# Patient Record
Sex: Female | Born: 1960 | Race: Black or African American | Hispanic: No | Smoking: Former smoker
Health system: Southern US, Community
[De-identification: ages and names within clinical notes are randomized; demographics above are authoritative.]

## PROBLEM LIST (undated history)

## (undated) DIAGNOSIS — I10 Essential (primary) hypertension: Secondary | ICD-10-CM

---
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
## 1988-07-12 HISTORY — PX: SALPINGECTOMY: SHX328

## 2007-04-24 ENCOUNTER — Emergency Department: Payer: Self-pay | Admitting: Emergency Medicine

## 2019-10-18 ENCOUNTER — Other Ambulatory Visit: Payer: Self-pay

## 2019-10-18 ENCOUNTER — Observation Stay
Admission: EM | Admit: 2019-10-18 | Discharge: 2019-10-21 | Disposition: A | Discharge status: Home / Self Care | Payer: BC Managed Care – PPO | Attending: Surgery | Admitting: Surgery

## 2019-10-18 ENCOUNTER — Emergency Department: Payer: BC Managed Care – PPO

## 2019-10-18 DIAGNOSIS — S82851A Displaced trimalleolar fracture of right lower leg, initial encounter for closed fracture: Secondary | ICD-10-CM | POA: Diagnosis present

## 2019-10-18 DIAGNOSIS — Z20822 Contact with and (suspected) exposure to covid-19: Secondary | ICD-10-CM | POA: Insufficient documentation

## 2019-10-18 DIAGNOSIS — W010XXA Fall on same level from slipping, tripping and stumbling without subsequent striking against object, initial encounter: Secondary | ICD-10-CM | POA: Diagnosis not present

## 2019-10-18 DIAGNOSIS — Z87891 Personal history of nicotine dependence: Secondary | ICD-10-CM | POA: Diagnosis not present

## 2019-10-18 DIAGNOSIS — S82891A Other fracture of right lower leg, initial encounter for closed fracture: Secondary | ICD-10-CM | POA: Diagnosis present

## 2019-10-18 DIAGNOSIS — Z8781 Personal history of (healed) traumatic fracture: Secondary | ICD-10-CM

## 2019-10-18 DIAGNOSIS — I10 Essential (primary) hypertension: Secondary | ICD-10-CM | POA: Insufficient documentation

## 2019-10-18 DIAGNOSIS — Y92096 Garden or yard of other non-institutional residence as the place of occurrence of the external cause: Secondary | ICD-10-CM | POA: Diagnosis not present

## 2019-10-18 DIAGNOSIS — Z01811 Encounter for preprocedural respiratory examination: Secondary | ICD-10-CM

## 2019-10-18 DIAGNOSIS — Z9889 Other specified postprocedural states: Secondary | ICD-10-CM

## 2019-10-18 DIAGNOSIS — Z419 Encounter for procedure for purposes other than remedying health state, unspecified: Secondary | ICD-10-CM

## 2019-10-18 HISTORY — DX: Essential (primary) hypertension: I10

## 2019-10-18 LAB — CBC WITH DIFFERENTIAL/PLATELET
Abs Immature Granulocytes: 0.07 10*3/uL (ref 0.00–0.07)
Basophils Absolute: 0 10*3/uL (ref 0.0–0.1)
Basophils Relative: 0 %
Eosinophils Absolute: 0 10*3/uL (ref 0.0–0.5)
Eosinophils Relative: 0 %
HCT: 40.8 % (ref 36.0–46.0)
Hemoglobin: 14 g/dL (ref 12.0–15.0)
Immature Granulocytes: 1 %
Lymphocytes Relative: 11 %
Lymphs Abs: 1.6 10*3/uL (ref 0.7–4.0)
MCH: 30.1 pg (ref 26.0–34.0)
MCHC: 34.3 g/dL (ref 30.0–36.0)
MCV: 87.7 fL (ref 80.0–100.0)
Monocytes Absolute: 0.6 10*3/uL (ref 0.1–1.0)
Monocytes Relative: 4 %
Neutro Abs: 12 10*3/uL — ABNORMAL HIGH (ref 1.7–7.7)
Neutrophils Relative %: 84 %
Platelets: 412 10*3/uL — ABNORMAL HIGH (ref 150–400)
RBC: 4.65 MIL/uL (ref 3.87–5.11)
RDW: 11.9 % (ref 11.5–15.5)
WBC: 14.3 10*3/uL — ABNORMAL HIGH (ref 4.0–10.5)
nRBC: 0 % (ref 0.0–0.2)

## 2019-10-18 LAB — COMPREHENSIVE METABOLIC PANEL
ALT: 18 U/L (ref 0–44)
AST: 21 U/L (ref 15–41)
Albumin: 4.5 g/dL (ref 3.5–5.0)
Alkaline Phosphatase: 93 U/L (ref 38–126)
Anion gap: 13 (ref 5–15)
BUN: 21 mg/dL — ABNORMAL HIGH (ref 6–20)
CO2: 25 mmol/L (ref 22–32)
Calcium: 9.7 mg/dL (ref 8.9–10.3)
Chloride: 99 mmol/L (ref 98–111)
Creatinine, Ser: 0.73 mg/dL (ref 0.44–1.00)
GFR calc Af Amer: >60 mL/min (ref >60)
GFR calc non Af Amer: >60 mL/min (ref >60)
Glucose, Bld: 125 mg/dL — ABNORMAL HIGH (ref 70–99)
Potassium: 2.6 mmol/L — CL (ref 3.5–5.1)
Sodium: 137 mmol/L (ref 135–145)
Total Bilirubin: 0.7 mg/dL (ref 0.3–1.2)
Total Protein: 8.3 g/dL — ABNORMAL HIGH (ref 6.5–8.1)

## 2019-10-18 MED ORDER — OXYCODONE HCL 5 MG PO TABS
5.0000 mg | ORAL_TABLET | ORAL | Status: DC | PRN
  Administered 2019-10-19: 08:00:00 5 mg via ORAL
  Filled 2019-10-18: qty 1

## 2019-10-18 MED ORDER — POTASSIUM CHLORIDE IN NACL 20-0.9 MEQ/L-% IV SOLN
Freq: Once | INTRAVENOUS | Status: AC
  Filled 2019-10-18: qty 1000

## 2019-10-18 MED ORDER — HYDROMORPHONE HCL 1 MG/ML IJ SOLN
1.0000 mg | Freq: Once | INTRAMUSCULAR | Status: AC
  Administered 2019-10-18: 23:00:00 1 mg via INTRAVENOUS
  Filled 2019-10-18: qty 1

## 2019-10-18 MED ORDER — FLEET ENEMA 7-19 GM/118ML RE ENEM: 1.0000 | ENEMA | Freq: Once | RECTAL | Status: DC | PRN

## 2019-10-18 MED ORDER — MAGNESIUM HYDROXIDE 400 MG/5ML PO SUSP: 30.0000 mL | Freq: Every day | ORAL | Status: DC | PRN

## 2019-10-18 MED ORDER — HYDROMORPHONE HCL 1 MG/ML IJ SOLN: 0.2500 mg | INTRAMUSCULAR | Status: DC | PRN

## 2019-10-18 MED ORDER — PANTOPRAZOLE SODIUM 40 MG IV SOLR
40.0000 mg | Freq: Every day | INTRAVENOUS | Status: DC
  Administered 2019-10-19: 40 mg via INTRAVENOUS
  Filled 2019-10-18: qty 40

## 2019-10-18 MED ORDER — ONDANSETRON 8 MG PO TBDP
8.0000 mg | ORAL_TABLET | Freq: Once | ORAL | Status: AC
  Administered 2019-10-18: 22:00:00 8 mg via ORAL
  Filled 2019-10-18: qty 1

## 2019-10-18 MED ORDER — POTASSIUM CHLORIDE CRYS ER 20 MEQ PO TBCR
40.0000 meq | EXTENDED_RELEASE_TABLET | Freq: Once | ORAL | Status: AC
  Administered 2019-10-19: 40 meq via ORAL
  Filled 2019-10-18: qty 2

## 2019-10-18 MED ORDER — POTASSIUM CHLORIDE 10 MEQ/100ML IV SOLN
10.0000 meq | INTRAVENOUS | Status: AC
  Administered 2019-10-19 (×2): 10 meq via INTRAVENOUS
  Filled 2019-10-18 (×2): qty 100

## 2019-10-18 MED ORDER — SODIUM CHLORIDE 0.9 % IV SOLN: INTRAVENOUS | Status: DC

## 2019-10-18 MED ORDER — ACETAMINOPHEN 325 MG PO TABS
650.0000 mg | ORAL_TABLET | Freq: Four times a day (QID) | ORAL | Status: DC | PRN
  Administered 2019-10-19: 650 mg via ORAL
  Filled 2019-10-18: qty 2

## 2019-10-18 MED ORDER — ACETAMINOPHEN 650 MG RE SUPP: 650.0000 mg | Freq: Four times a day (QID) | RECTAL | Status: DC | PRN

## 2019-10-18 MED ORDER — CEFAZOLIN SODIUM-DEXTROSE 2-4 GM/100ML-% IV SOLN
2.0000 g | Freq: Once | INTRAVENOUS | Status: DC
  Filled 2019-10-18: qty 100

## 2019-10-18 MED ORDER — BISACODYL 10 MG RE SUPP
10.0000 mg | Freq: Every day | RECTAL | Status: DC | PRN
  Filled 2019-10-18: qty 1

## 2019-10-18 MED ORDER — SODIUM CHLORIDE 0.9 % IV BOLUS
1000.0000 mL | Freq: Once | INTRAVENOUS | Status: AC
  Administered 2019-10-18: 1000 mL via INTRAVENOUS

## 2019-10-18 MED ORDER — DOCUSATE SODIUM 100 MG PO CAPS: 100.0000 mg | ORAL_CAPSULE | Freq: Two times a day (BID) | ORAL | Status: DC

## 2019-10-18 NOTE — ED Provider Notes (Signed)
[facility] Provider Note  ____________________________________________  Time seen: Approximately 9:42 PM  I have reviewed the triage vital signs and the nursing notes.   HISTORY  Chief Complaint Ankle Pain    HPI Stephanie Matthews is a 59 y.o. female who presents the emergency department complaining of right ankle pain/injury.  Patient states that she was walking down her car, slipped on some the loss of her yard and felt her ankle rotate.  Patient reports that she heard a sharp crack, had immediate pain and swelling.  Patient sustained no other injuries during this incident.  She denies hitting her head or losing consciousness.  Patient reports inability to stand or move the ankle joint at this time.  Patient has a history of hypertension but no other chronic medical problems.  No other complaints at this time.         No past medical history on file.  There are no problems to display for this patient.     Prior to Admission medications   Not on File    Allergies Patient has no allergy information on record.  No family history on file.  Social History Social History   Tobacco Use  . Smoking status: Not on file  Substance Use Topics  . Alcohol use: Not on file  . Drug use: Not on file     Review of Systems  Constitutional: No fever/chills Eyes: No visual changes. No discharge ENT: No upper respiratory complaints. Cardiovascular: no chest pain. Respiratory: no cough. No SOB. Gastrointestinal: No abdominal pain.  No nausea, no vomiting.  No diarrhea.  No constipation. Musculoskeletal: Positive for right ankle pain/injury. Skin: Negative for rash, abrasions, lacerations, ecchymosis. Neurological: Negative for headaches, focal weakness or numbness. 10-point ROS otherwise negative.  ____________________________________________   PHYSICAL EXAM:  VITAL SIGNS: ED Triage Vitals  Enc Vitals Group     BP 10/18/19  1943 (!) 175/91     Pulse Rate 10/18/19 1943 (!) 102     Resp 10/18/19 1943 20     Temp 10/18/19 1943 97.9 F (36.6 C)     Temp Source 10/18/19 1943 Oral     SpO2 10/18/19 1944 100 %     Weight 10/18/19 1944 155 lb (70.3 kg)     Height 10/18/19 1944 5\' 7"  (1.702 m)     Head Circumference --      Peak Flow --      Pain Score 10/18/19 1944 4     Pain Loc --      Pain Edu? --      Excl. in [place]? --      Constitutional: Alert and oriented. Well appearing and in no acute distress. Eyes: Conjunctivae are normal. PERRL. EOMI. Head: Atraumatic. ENT:      Ears:       Nose: No congestion/rhinnorhea.      Mouth/Throat: Mucous membranes are moist.  Neck: No stridor.    Cardiovascular: Normal rate, regular rhythm. Normal S1 and S2.  Good peripheral circulation. Respiratory: Normal respiratory effort without tachypnea or retractions. Lungs CTAB. Good air entry to the bases with no decreased or absent breath sounds. Musculoskeletal: Full range of motion to all extremities. No gross deformities appreciated.  Grossly edematous right ankle is appreciated.  No gross deformity.  Patient is exquisitely tender to palpation all about the ankle joint itself.  This includes bilateral malleolus's, posterior aspect, anterior joint line.  No tenderness to palpation over the foot itself.  Dorsalis  pedis pulse intact.  Sensation intact all digits.  No range of motion at this time. Neurologic:  Normal speech and language. No gross focal neurologic deficits are appreciated.  Skin:  Skin is warm, dry and intact. No rash noted. Psychiatric: Mood and affect are normal. Speech and behavior are normal. Patient exhibits appropriate insight and judgement.   ____________________________________________   LABS (all labs ordered are listed, but only abnormal results are displayed)  Labs Reviewed  COMPREHENSIVE METABOLIC PANEL  CBC WITH DIFFERENTIAL/PLATELET    ____________________________________________  EKG   ____________________________________________  RADIOLOGY I personally viewed and evaluated these images as part of my medical decision making, as well as reviewing the written report by the radiologist.  DG Ankle Complete Right  Result Date: 10/18/2019 CLINICAL DATA:  59 year old female status post right ankle rolling injury while walking outside. EXAM: RIGHT ANKLE - COMPLETE 3+ VIEW COMPARISON:  None. FINDINGS: Severe soft tissue swelling about the right ankle. Comminuted, transverse fracture through the medial malleolus. Mild lateral displacement of the mortise joint. Comminuted spiral fracture through the distal right fibula metadiaphysis. Mildly displaced fracture of the posterior malleolus. Talar dome intact. Calcaneus intact. Other visible bones of the right foot appear intact. IMPRESSION: Acute comminuted right ankle trimalleolar fracture with mild laterally dislocated mortise joint. Electronically Signed   By: Odessa Fleming M.D.   On: 10/18/2019 20:03    ____________________________________________    PROCEDURES  Procedure(s) performed:    .Splint Application  Date/Time: 10/18/2019 9:53 PM Performed by: Racheal Patches, PA-C Authorized by: Racheal Patches, PA-C   Consent:    Consent obtained:  Verbal   Consent given by:  Patient   Risks discussed:  Pain and swelling Pre-procedure details:    Sensation:  Normal Procedure details:    Laterality:  Right   Location:  Ankle   Ankle:  R ankle   Splint type:  Short leg and ankle stirrup   Supplies:  Cotton padding, Ortho-Glass and elastic bandage Post-procedure details:    Pain:  Improved   Sensation:  Normal   Patient tolerance of procedure:  Tolerated well, no immediate complications      Medications  HYDROmorphone (DILAUDID) injection 1 mg (has no administration in time range)  ondansetron (ZOFRAN-ODT) disintegrating tablet 8 mg (has no administration  in time range)  sodium chloride 0.9 % bolus 1,000 mL (has no administration in time range)     ____________________________________________   INITIAL IMPRESSION / ASSESSMENT AND PLAN / ED COURSE  Pertinent labs & imaging results that were available during my care of the patient were reviewed by me and considered in my medical decision making (see chart for details).  Review of the [place] CSRS was performed in accordance of the [facility] prior to dispensing any controlled drugs.           Patient's diagnosis is consistent with fall, trimalleolar fracture.  Patient presented to the emergency department with an ankle injury after a mechanical fall.  Patient denied any other complaints or injury other than right ankle pain.  Initial imaging revealed trimalleolar fracture with mild displacement of the ankle mortise joint.  Sensation and capillary refill still intact.  Orthopedic surgeon, Dr. Joice Lofts was consulted after imaging.  He advises that the patient should be admitted, with surgical repair.  Patient will be placed in a posterior OCL with stirrup ankle splint.    ____________________________________________  FINAL CLINICAL IMPRESSION(S) / ED DIAGNOSES  Final diagnoses:  Closed trimalleolar fracture of right ankle, initial encounter  NEW MEDICATIONS STARTED DURING THIS VISIT:  ED Discharge Orders    None          This chart was dictated using voice recognition software/Dragon. Despite best efforts to proofread, errors can occur which can change the meaning. Any change was purely unintentional.    Racheal Patches, PA-C 10/18/19 2154    Miguel Aschoff., MD 10/19/19 [phone]

## 2019-10-18 NOTE — ED Triage Notes (Signed)
Pt was walking outside when she stepped on a round object causing her to roll her ankle. Pt with large amt of swelling noted to right ankle.

## 2019-10-19 ENCOUNTER — Observation Stay: Payer: BC Managed Care – PPO

## 2019-10-19 ENCOUNTER — Other Ambulatory Visit: Payer: Self-pay

## 2019-10-19 ENCOUNTER — Observation Stay: Payer: BC Managed Care – PPO | Admitting: Anesthesiology

## 2019-10-19 ENCOUNTER — Encounter: Payer: Self-pay | Admitting: Surgery

## 2019-10-19 ENCOUNTER — Encounter
Admission: EM | Disposition: A | Discharge status: Home / Self Care | Payer: Self-pay | Source: Home / Self Care | Attending: Student

## 2019-10-19 HISTORY — PX: ORIF ANKLE FRACTURE: SHX5408

## 2019-10-19 LAB — BASIC METABOLIC PANEL
Anion gap: 8 (ref 5–15)
BUN: 16 mg/dL (ref 6–20)
CO2: 26 mmol/L (ref 22–32)
Calcium: 8.9 mg/dL (ref 8.9–10.3)
Chloride: 102 mmol/L (ref 98–111)
Creatinine, Ser: 0.56 mg/dL (ref 0.44–1.00)
GFR calc Af Amer: >60 mL/min (ref >60)
GFR calc non Af Amer: >60 mL/min (ref >60)
Glucose, Bld: 115 mg/dL — ABNORMAL HIGH (ref 70–99)
Potassium: 3.5 mmol/L (ref 3.5–5.1)
Sodium: 136 mmol/L (ref 135–145)

## 2019-10-19 LAB — URINALYSIS, ROUTINE W REFLEX MICROSCOPIC
Bilirubin Urine: NEGATIVE
Glucose, UA: NEGATIVE mg/dL
Hgb urine dipstick: NEGATIVE
Ketones, ur: NEGATIVE mg/dL
Leukocytes,Ua: NEGATIVE
Nitrite: NEGATIVE
Protein, ur: NEGATIVE mg/dL
Specific Gravity, Urine: 1.021 (ref 1.005–1.030)
pH: 5 (ref 5.0–8.0)

## 2019-10-19 LAB — SARS CORONAVIRUS 2 (TAT 6-24 HRS): SARS Coronavirus 2: NEGATIVE

## 2019-10-19 LAB — HIV ANTIBODY (ROUTINE TESTING W REFLEX): HIV Screen 4th Generation wRfx: NONREACTIVE

## 2019-10-19 SURGERY — OPEN REDUCTION INTERNAL FIXATION (ORIF) ANKLE FRACTURE
Anesthesia: General | Site: Ankle | Laterality: Right

## 2019-10-19 MED ORDER — SUCCINYLCHOLINE CHLORIDE 200 MG/10ML IV SOSY
PREFILLED_SYRINGE | INTRAVENOUS | Status: AC
  Filled 2019-10-19: qty 10

## 2019-10-19 MED ORDER — ONDANSETRON HCL 4 MG/2ML IJ SOLN
4.0000 mg | Freq: Once | INTRAMUSCULAR | Status: AC
  Administered 2019-10-19: 4 mg via INTRAVENOUS

## 2019-10-19 MED ORDER — MAGNESIUM HYDROXIDE 400 MG/5ML PO SUSP
30.0000 mL | Freq: Every day | ORAL | Status: DC | PRN
  Administered 2019-10-20: 19:00:00 30 mL via ORAL
  Filled 2019-10-19 (×3): qty 30

## 2019-10-19 MED ORDER — ONDANSETRON HCL 4 MG/2ML IJ SOLN
INTRAMUSCULAR | Status: AC
  Filled 2019-10-19: qty 2

## 2019-10-19 MED ORDER — DOCUSATE SODIUM 100 MG PO CAPS
100.0000 mg | ORAL_CAPSULE | Freq: Two times a day (BID) | ORAL | Status: DC
  Administered 2019-10-19 – 2019-10-21 (×3): 100 mg via ORAL
  Filled 2019-10-19 (×7): qty 1

## 2019-10-19 MED ORDER — LACTATED RINGERS IV SOLN: INTRAVENOUS | Status: DC | PRN

## 2019-10-19 MED ORDER — ONDANSETRON HCL 4 MG/2ML IJ SOLN
INTRAMUSCULAR | Status: DC | PRN
  Administered 2019-10-19: 4 mg via INTRAVENOUS

## 2019-10-19 MED ORDER — ACETAMINOPHEN 10 MG/ML IV SOLN
INTRAVENOUS | Status: AC
  Filled 2019-10-19: qty 100

## 2019-10-19 MED ORDER — METOCLOPRAMIDE HCL 10 MG PO TABS: 5.0000 mg | ORAL_TABLET | Freq: Three times a day (TID) | ORAL | Status: DC | PRN

## 2019-10-19 MED ORDER — LOSARTAN POTASSIUM 50 MG PO TABS: 75.0000 mg | ORAL_TABLET | Freq: Every day | ORAL | Status: DC

## 2019-10-19 MED ORDER — TRAMADOL HCL 50 MG PO TABS: 50.0000 mg | ORAL_TABLET | Freq: Four times a day (QID) | ORAL | Status: DC

## 2019-10-19 MED ORDER — FLEET ENEMA 7-19 GM/118ML RE ENEM: 1.0000 | ENEMA | Freq: Once | RECTAL | Status: DC | PRN

## 2019-10-19 MED ORDER — FENTANYL CITRATE (PF) 100 MCG/2ML IJ SOLN
25.0000 ug | INTRAMUSCULAR | Status: DC | PRN
  Administered 2019-10-19: 25 ug via INTRAVENOUS

## 2019-10-19 MED ORDER — CEFAZOLIN SODIUM-DEXTROSE 2-4 GM/100ML-% IV SOLN
2.0000 g | Freq: Four times a day (QID) | INTRAVENOUS | Status: AC
  Administered 2019-10-20: 01:00:00 2 g via INTRAVENOUS
  Filled 2019-10-19: qty 100

## 2019-10-19 MED ORDER — DEXAMETHASONE SODIUM PHOSPHATE 10 MG/ML IJ SOLN
INTRAMUSCULAR | Status: AC
  Filled 2019-10-19: qty 1

## 2019-10-19 MED ORDER — MIDAZOLAM HCL 2 MG/2ML IJ SOLN
INTRAMUSCULAR | Status: AC
  Filled 2019-10-19: qty 2

## 2019-10-19 MED ORDER — DEXAMETHASONE SODIUM PHOSPHATE 10 MG/ML IJ SOLN
INTRAMUSCULAR | Status: DC | PRN
  Administered 2019-10-19: 8 mg via INTRAVENOUS

## 2019-10-19 MED ORDER — HYDROMORPHONE HCL 1 MG/ML IJ SOLN: 0.2500 mg | INTRAMUSCULAR | Status: DC | PRN

## 2019-10-19 MED ORDER — PROPOFOL 10 MG/ML IV BOLUS
INTRAVENOUS | Status: AC
  Filled 2019-10-19: qty 20

## 2019-10-19 MED ORDER — LIDOCAINE HCL (CARDIAC) PF 100 MG/5ML IV SOSY
PREFILLED_SYRINGE | INTRAVENOUS | Status: DC | PRN
  Administered 2019-10-19 (×2): 50 mg via INTRAVENOUS

## 2019-10-19 MED ORDER — FENTANYL CITRATE (PF) 100 MCG/2ML IJ SOLN
INTRAMUSCULAR | Status: AC
  Filled 2019-10-19: qty 2

## 2019-10-19 MED ORDER — SUCCINYLCHOLINE CHLORIDE 20 MG/ML IJ SOLN
INTRAMUSCULAR | Status: DC | PRN
  Administered 2019-10-19: 100 mg via INTRAVENOUS

## 2019-10-19 MED ORDER — ACETAMINOPHEN 10 MG/ML IV SOLN
INTRAVENOUS | Status: DC | PRN
  Administered 2019-10-19: 1000 mg via INTRAVENOUS

## 2019-10-19 MED ORDER — OXYCODONE HCL 5 MG PO TABS: 5.0000 mg | ORAL_TABLET | ORAL | Status: DC | PRN

## 2019-10-19 MED ORDER — KETOROLAC TROMETHAMINE 15 MG/ML IJ SOLN
15.0000 mg | Freq: Four times a day (QID) | INTRAMUSCULAR | Status: AC
  Administered 2019-10-20: 15 mg via INTRAVENOUS

## 2019-10-19 MED ORDER — ENOXAPARIN SODIUM 40 MG/0.4ML ~~LOC~~ SOLN
40.0000 mg | SUBCUTANEOUS | Status: DC
  Administered 2019-10-21: 08:00:00 40 mg via SUBCUTANEOUS

## 2019-10-19 MED ORDER — KETOROLAC TROMETHAMINE 30 MG/ML IJ SOLN
INTRAMUSCULAR | Status: AC
  Administered 2019-10-19: 15:00:00 30 mg via INTRAVENOUS
  Filled 2019-10-19: qty 1

## 2019-10-19 MED ORDER — ONDANSETRON HCL 4 MG/2ML IJ SOLN: 4.0000 mg | Freq: Four times a day (QID) | INTRAMUSCULAR | Status: DC | PRN

## 2019-10-19 MED ORDER — DIPHENHYDRAMINE HCL 12.5 MG/5ML PO ELIX
12.5000 mg | ORAL_SOLUTION | ORAL | Status: DC | PRN
  Filled 2019-10-19: qty 10

## 2019-10-19 MED ORDER — METOCLOPRAMIDE HCL 5 MG/ML IJ SOLN: 5.0000 mg | Freq: Three times a day (TID) | INTRAMUSCULAR | Status: DC | PRN

## 2019-10-19 MED ORDER — CEFAZOLIN SODIUM-DEXTROSE 2-4 GM/100ML-% IV SOLN
INTRAVENOUS | Status: AC
  Administered 2019-10-19: 13:00:00 2 g via INTRAVENOUS
  Filled 2019-10-19: qty 100

## 2019-10-19 MED ORDER — FENTANYL CITRATE (PF) 100 MCG/2ML IJ SOLN
INTRAMUSCULAR | Status: DC | PRN
  Administered 2019-10-19 (×3): 50 ug via INTRAVENOUS

## 2019-10-19 MED ORDER — ACETAMINOPHEN 325 MG PO TABS: 325.0000 mg | ORAL_TABLET | Freq: Four times a day (QID) | ORAL | Status: DC | PRN

## 2019-10-19 MED ORDER — CEFAZOLIN SODIUM-DEXTROSE 2-4 GM/100ML-% IV SOLN
INTRAVENOUS | Status: AC
  Administered 2019-10-19: 19:00:00 2 g via INTRAVENOUS
  Filled 2019-10-19: qty 100

## 2019-10-19 MED ORDER — OXYCODONE HCL 5 MG/5ML PO SOLN
ORAL | Status: AC
  Filled 2019-10-19: qty 5

## 2019-10-19 MED ORDER — CHLORTHALIDONE 25 MG PO TABS
25.0000 mg | ORAL_TABLET | Freq: Every morning | ORAL | Status: DC
  Administered 2019-10-21: 10:00:00 25 mg via ORAL
  Filled 2019-10-19 (×2): qty 1

## 2019-10-19 MED ORDER — CEFAZOLIN SODIUM-DEXTROSE 2-3 GM-%(50ML) IV SOLR
INTRAVENOUS | Status: DC | PRN
  Administered 2019-10-19: 2 g via INTRAVENOUS

## 2019-10-19 MED ORDER — ACETAMINOPHEN 500 MG PO TABS
ORAL_TABLET | ORAL | Status: AC
  Administered 2019-10-19: 21:00:00 1000 mg via ORAL
  Filled 2019-10-19: qty 2

## 2019-10-19 MED ORDER — KETOROLAC TROMETHAMINE 15 MG/ML IJ SOLN
INTRAMUSCULAR | Status: AC
  Administered 2019-10-19: 22:00:00 15 mg via INTRAVENOUS
  Filled 2019-10-19: qty 1

## 2019-10-19 MED ORDER — KETOROLAC TROMETHAMINE 30 MG/ML IJ SOLN: 30.0000 mg | Freq: Once | INTRAMUSCULAR | Status: AC

## 2019-10-19 MED ORDER — BISACODYL 10 MG RE SUPP
10.0000 mg | Freq: Every day | RECTAL | Status: DC | PRN
  Filled 2019-10-19: qty 1

## 2019-10-19 MED ORDER — ACETAMINOPHEN 500 MG PO TABS: 1000.0000 mg | ORAL_TABLET | Freq: Four times a day (QID) | ORAL | Status: AC

## 2019-10-19 MED ORDER — BUPIVACAINE HCL (PF) 0.5 % IJ SOLN
INTRAMUSCULAR | Status: AC
  Filled 2019-10-19: qty 30

## 2019-10-19 MED ORDER — ONDANSETRON HCL 4 MG PO TABS
4.0000 mg | ORAL_TABLET | Freq: Four times a day (QID) | ORAL | Status: DC | PRN
  Administered 2019-10-20: 14:00:00 4 mg via ORAL

## 2019-10-19 MED ORDER — OXYCODONE HCL 5 MG/5ML PO SOLN
5.0000 mg | Freq: Once | ORAL | Status: AC | PRN
  Administered 2019-10-19: 5 mg via ORAL

## 2019-10-19 MED ORDER — BUPIVACAINE HCL 0.5 % IJ SOLN
INTRAMUSCULAR | Status: DC | PRN
  Administered 2019-10-19: 30 mL

## 2019-10-19 MED ORDER — FENTANYL CITRATE (PF) 100 MCG/2ML IJ SOLN
INTRAMUSCULAR | Status: AC
  Administered 2019-10-19: 16:00:00 25 ug via INTRAVENOUS
  Filled 2019-10-19: qty 2

## 2019-10-19 MED ORDER — TRAMADOL HCL 50 MG PO TABS
ORAL_TABLET | ORAL | Status: AC
  Administered 2019-10-19: 50 mg via ORAL
  Filled 2019-10-19: qty 1

## 2019-10-19 MED ORDER — PROPOFOL 10 MG/ML IV BOLUS
INTRAVENOUS | Status: DC | PRN
  Administered 2019-10-19: 150 mg via INTRAVENOUS

## 2019-10-19 MED ORDER — POTASSIUM CHLORIDE IN NACL 20-0.9 MEQ/L-% IV SOLN
INTRAVENOUS | Status: DC
  Filled 2019-10-19 (×5): qty 1000

## 2019-10-19 MED ORDER — PROMETHAZINE HCL 25 MG/ML IJ SOLN: 6.2500 mg | INTRAMUSCULAR | Status: DC | PRN

## 2019-10-19 MED ORDER — OXYCODONE HCL 5 MG PO TABS: 5.0000 mg | ORAL_TABLET | Freq: Once | ORAL | Status: AC | PRN

## 2019-10-19 SURGICAL SUPPLY — 63 items
BIT DRILL 2.5X2.75 QC CALB (BIT) ×3 IMPLANT
BIT DRILL 2.9 CANN QC NONSTRL (BIT) ×3 IMPLANT
BIT DRILL 3.5X5.5 QC CALB (BIT) ×3 IMPLANT
BIT DRILL CALIBRATED 2.7 (BIT) ×2 IMPLANT
BIT DRILL CALIBRATED 2.7MM (BIT) ×1
BLADE SURG SZ10 CARB STEEL (BLADE) ×6 IMPLANT
BNDG COHESIVE 4X5 TAN STRL (GAUZE/BANDAGES/DRESSINGS) ×3 IMPLANT
BNDG ELASTIC 4X5.8 VLCR STR LF (GAUZE/BANDAGES/DRESSINGS) ×6 IMPLANT
BNDG ELASTIC 6X5.8 VLCR STR LF (GAUZE/BANDAGES/DRESSINGS) ×3 IMPLANT
BNDG ESMARK 6X12 TAN STRL LF (GAUZE/BANDAGES/DRESSINGS) ×3 IMPLANT
BNDG PLASTER FAST 4X5 WHT LF (CAST SUPPLIES) IMPLANT
CANISTER SUCT 1200ML W/VALVE (MISCELLANEOUS) ×3 IMPLANT
CHLORAPREP W/TINT 26 (MISCELLANEOUS) ×6 IMPLANT
COVER WAND RF STERILE (DRAPES) ×3 IMPLANT
CUFF TOURN SGL QUICK 18X4 (TOURNIQUET CUFF) ×3 IMPLANT
CUFF TOURN SGL QUICK 24 (TOURNIQUET CUFF)
CUFF TOURN SGL QUICK 30 (TOURNIQUET CUFF)
CUFF TRNQT CYL 24X4X16.5-23 (TOURNIQUET CUFF) IMPLANT
CUFF TRNQT CYL 30X4X21-28X (TOURNIQUET CUFF) IMPLANT
DRAPE C-ARM XRAY 36X54 (DRAPES) ×3 IMPLANT
DRAPE C-ARMOR (DRAPES) ×3 IMPLANT
DRAPE INCISE IOBAN 66X45 STRL (DRAPES) ×3 IMPLANT
DRAPE SPLIT 6X30 W/TAPE (DRAPES) ×3 IMPLANT
DRAPE U-SHAPE 47X51 STRL (DRAPES) ×3 IMPLANT
ELECT CAUTERY BLADE 6.4 (BLADE) ×3 IMPLANT
ELECT REM PT RETURN 9FT ADLT (ELECTROSURGICAL) ×3
ELECTRODE REM PT RTRN 9FT ADLT (ELECTROSURGICAL) ×1 IMPLANT
GAUZE SPONGE 4X4 12PLY STRL (GAUZE/BANDAGES/DRESSINGS) ×3 IMPLANT
GAUZE XEROFORM 1X8 LF (GAUZE/BANDAGES/DRESSINGS) ×3 IMPLANT
GLOVE BIO SURGEON STRL SZ8 (GLOVE) ×6 IMPLANT
GLOVE INDICATOR 8.0 STRL GRN (GLOVE) ×3 IMPLANT
GOWN STRL REUS W/ TWL LRG LVL3 (GOWN DISPOSABLE) ×1 IMPLANT
GOWN STRL REUS W/ TWL XL LVL3 (GOWN DISPOSABLE) ×1 IMPLANT
GOWN STRL REUS W/TWL LRG LVL3 (GOWN DISPOSABLE) ×2
GOWN STRL REUS W/TWL XL LVL3 (GOWN DISPOSABLE) ×2
HEMOVAC 400ML (MISCELLANEOUS) ×3
K-WIRE ACE 1.6X6 (WIRE) ×6
KIT DRAIN HEMOVAC JP 7FR 400ML (MISCELLANEOUS) ×1 IMPLANT
KIT TURNOVER KIT A (KITS) ×3 IMPLANT
KWIRE ACE 1.6X6 (WIRE) ×2 IMPLANT
LABEL OR SOLS (LABEL) ×3 IMPLANT
NS IRRIG 1000ML POUR BTL (IV SOLUTION) ×3 IMPLANT
PACK EXTREMITY ARMC (MISCELLANEOUS) ×3 IMPLANT
PAD ABD DERMACEA PRESS 5X9 (GAUZE/BANDAGES/DRESSINGS) ×6 IMPLANT
PAD CAST CTTN 4X4 STRL (SOFTGOODS) ×2 IMPLANT
PAD PREP 24X41 OB/GYN DISP (PERSONAL CARE ITEMS) ×3 IMPLANT
PADDING CAST COTTON 4X4 STRL (SOFTGOODS) ×4
PLATE LOCK 7H 92 BILAT FIB (Plate) ×3 IMPLANT
SCREW ACE CAN 4.0 40M (Screw) ×3 IMPLANT
SCREW CORTICAL 3.5MM  28MM (Screw) ×2 IMPLANT
SCREW CORTICAL 3.5MM 28MM (Screw) ×1 IMPLANT
SCREW LOCK CORT STAR 3.5X12 (Screw) ×9 IMPLANT
SCREW NON LOCKING LP 3.5 14MM (Screw) ×3 IMPLANT
SCREW NON LOCKING LP 3.5 16MM (Screw) ×6 IMPLANT
SPLINT CAST 1 STEP 4X30 (MISCELLANEOUS) ×6 IMPLANT
SPONGE LAP 18X18 RF (DISPOSABLE) ×3 IMPLANT
STAPLER SKIN PROX 35W (STAPLE) ×3 IMPLANT
STOCKINETTE IMPERV 14X48 (MISCELLANEOUS) ×3 IMPLANT
SUT VIC AB 0 CT1 36 (SUTURE) ×3 IMPLANT
SUT VIC AB 2-0 CT2 27 (SUTURE) ×3 IMPLANT
SUT VIC AB 2-0 SH 27 (SUTURE) ×4
SUT VIC AB 2-0 SH 27XBRD (SUTURE) ×2 IMPLANT
SYR 10ML LL (SYRINGE) ×3 IMPLANT

## 2019-10-19 NOTE — ED Notes (Signed)
Surgeon was here to speak with pt and explain procedure

## 2019-10-19 NOTE — Transfer of Care (Signed)
Immediate Anesthesia Transfer of Care Note  Patient: Stephanie Matthews  Procedure(s) Performed: OPEN REDUCTION INTERNAL FIXATION (ORIF) ANKLE FRACTURE (Right Ankle)  Patient Location: PACU  Anesthesia Type:General  Level of Consciousness: sedated  Airway & Oxygen Therapy: Patient Spontanous Breathing and Patient connected to face mask oxygen  Post-op Assessment: Report given to RN and Post -op Vital signs reviewed and stable  Post vital signs: Reviewed and stable  Last Vitals:  Vitals Value Taken Time  BP 130/94 10/19/19 1503  Temp 36.1 C 10/19/19 1503  Pulse 89 10/19/19 1509  Resp 14 10/19/19 1509  SpO2 100 % 10/19/19 1509  Vitals shown include unvalidated device data.  Last Pain:  Vitals:   10/19/19 1503  TempSrc:   PainSc: Asleep         Complications: No apparent anesthesia complications

## 2019-10-19 NOTE — Progress Notes (Signed)
Back to bed  Used BSC   Circulation good to toes elevated leg on pillows with ice pack

## 2019-10-19 NOTE — ED Notes (Signed)
Pt taken to OR (pre op). She has her purse and her clothing with her

## 2019-10-19 NOTE — Op Note (Signed)
10/19/2019  3:03 PM  Patient:   Stephanie Matthews  Pre-Op Diagnosis:   Closed displaced trimalleolar fracture, right ankle.  Post-Op Diagnosis:   Same.  Procedure:   Open reduction and internal fixation of displaced medial and lateral malleolar line, right ankle.  Surgeon:   Pascal Lux, MD  Assistant:   None  Anesthesia:   GET  Findings:   As above.  Complications:   None  EBL:   50 cc  Fluids:   300 cc crystalloid  UOP:   None  TT:   80 min at 250 mmHg  Drains:   None  Closure:   Staples  Implants:   Biomet ALPS 7-hole composite locking plate and screws  Brief Clinical Note:   The patient is a 59 year old female who sustained the above-noted injury last evening when she apparently rolled her ankle when she stepped on some "gum balls" in her yard while walking to her car. She presented to the emergency room where x-rays demonstrated the above-noted injury. The patient was admitted and presents at this time for definitive management of his/her injury.  Procedure:   The patient was brought into the operating room and lain in the supine position.  After adequate general endotracheal intubation and anesthesia was obtained, the patient's right foot and lower leg were prepped with ChloraPrep solution, then draped sterilely. Preoperative antibiotics were administered. A timeout was performed to verify the appropriate surgical site before the limb was exsanguinated with an Esmarch and the calf tourniquet inflated to 250 mmHg.   Laterally, an 8-10 cm incision was made over the lateral aspect of the distal fibula. The incision was carried down through the subcutaneous tissues to expose the fracture site. The fracture hematoma was debrided before the fracture was reduced and temporarily secured using a bone clamp. A lag screw was placed in an anterior to posterior direction perpendicular to the fracture. A 7-hole Biomet composite locking plate was contoured using the appropriate plate  benders before it was applied over the lateral aspect of the distal fibula. After verifying its position fluoroscopically, it was secured using a 3.5 mm nonlocking cortical screw proximal to the fracture. Again the plate's position was adjusted slightly based on AP and lateral projections before it was secured using additional bicortical screws proximally and multiple locking screws distally. The adequacy of fracture reduction and hardware position was verified fluoroscopically in AP and lateral projections and found to be excellent.   Attention was directed to the medial side. An approximately 3-4 cm longitudinal incision was made over the anterior and distal portions of the medial malleolus. This incision also was carried down through the subcutaneous tissues to expose the fracture site. Care was taken to identify and protect the saphenous nerve and vein. The fracture hematoma again was removed before the fracture was reduced. Two small loose medial cortical fragments were removed as well.  A single guidewire was placed obliquely across the fracture from distal to proximal into the distal tibial metaphysis. Consideration was given to placing a second K wire, but the fracture fragment was too small to accommodate a second screw. After verifying its position fluoroscopically, the guidewire was over-reamed and replaced with a 40 mm partially threaded 4.0 cancellous screw in lag fashion. Again the adequacy of fracture reduction, hardware position, and mortise restoration was verified in AP, lateral, and oblique projections and found to be excellent.  Each wound was copiously irrigated with sterile saline solution. Laterally, the subcutaneous tissues were closed in two  layers using 2-0 Vicryl interrupted sutures before the skin was closed using staples. Medially, the subcutaneous tissues were closed using 2-0 Vicryl interrupted sutures before the skin was closed using staples. A total of 30 cc of 0.5% plain  Sensorcaine was injected in and around the incision sites to help with postoperative analgesia. Sterile bulky dressings were applied to the wounds before the patient was placed into a posterior splint with a sugar tong supplement, maintaining the ankle in neutral dorsiflexion. The patient was then awakened, extubated, and returned to the recovery room in satisfactory condition after tolerating the procedure well.

## 2019-10-19 NOTE — Anesthesia Preprocedure Evaluation (Signed)
Anesthesia Evaluation  Patient identified by MRN, date of birth, ID band Patient awake    Reviewed: Allergy & Precautions, H&P , NPO status , Patient's Chart, lab work & pertinent test results  Airway Mallampati: II  TM Distance: >3 FB Neck ROM: full    Dental  (+) Partial Upper Overbite (has an upper partial):   Pulmonary neg pulmonary ROS, neg COPD, former smoker,           Cardiovascular hypertension, (-) angina(-) Past MI (-) dysrhythmias      Neuro/Psych negative neurological ROS  negative psych ROS   GI/Hepatic negative GI ROS, Neg liver ROS,   Endo/Other  negative endocrine ROS  Renal/GU      Musculoskeletal   Abdominal   Peds  Hematology negative hematology ROS (+)   Anesthesia Other Findings Nausea  Past Medical History: No date: Hypertension  Past Surgical History: 1979: BREAST LUMPECTOMY; Left 1990: SALPINGECTOMY; Bilateral  BMI    Body Mass Index: 24.28 kg/m      Reproductive/Obstetrics negative OB ROS                             Anesthesia Physical Anesthesia Plan  ASA: II  Anesthesia Plan: General ETT and Rapid Sequence   Post-op Pain Management:    Induction:   PONV Risk Score and Plan: Ondansetron, Dexamethasone, Midazolam and Treatment may vary due to age or medical condition  Airway Management Planned: Natural Airway and Nasal Cannula  Additional Equipment:   Intra-op Plan:   Post-operative Plan:   Informed Consent: I have reviewed the patients History and Physical, chart, labs and discussed the procedure including the risks, benefits and alternatives for the proposed anesthesia with the patient or authorized representative who has indicated his/her understanding and acceptance.     Dental Advisory Given  Plan Discussed with: Anesthesiologist  Anesthesia Plan Comments:         Anesthesia Quick Evaluation

## 2019-10-19 NOTE — Anesthesia Procedure Notes (Signed)
Procedure Name: Intubation Date/Time: 10/19/2019 1:06 PM Performed by: Omer Jack, CRNA Pre-anesthesia Checklist: Patient identified, Patient being monitored, Timeout performed, Emergency Drugs available and Suction available Patient Re-evaluated:Patient Re-evaluated prior to induction Oxygen Delivery Method: Circle system utilized Preoxygenation: Pre-oxygenation with 100% oxygen Induction Type: IV induction, Cricoid Pressure applied and Rapid sequence Ventilation: Mask ventilation without difficulty Laryngoscope Size: 3 and McGraph Grade View: Grade II Tube type: Oral Tube size: 7.0 mm Number of attempts: 2 Airway Equipment and Method: Stylet and Bougie stylet Placement Confirmation: ETT inserted through vocal cords under direct vision,  positive ETCO2 and breath sounds checked- equal and bilateral Secured at: 22 cm Tube secured with: Tape Dental Injury: Teeth and Oropharynx as per pre-operative assessment  Difficulty Due To: Difficulty was unanticipated and Difficult Airway- due to anterior larynx Future Recommendations: Recommend- induction with short-acting agent, and alternative techniques readily available Comments: Right lower tooth very loose. Still intact

## 2019-10-19 NOTE — H&P (Signed)
Subjective:  Chief complaint: Right ankle pain.  The patient is a 59 y.o. female who sustained an injury to the right ankle last evening when she apparently slipped on some "gum balls" in her yard. The patient was unable to bear weight on her ankle following this injury, so she was brought to the emergency room where x-rays demonstrated a trimalleolar fracture subluxation of the right ankle. The patient denies any associated injury. The patient did not strike her head or lose consciousness. The patient also denies any light-headedness, dizziness, chest pain, or shortness of breath which might have contributed to the injury.  The patient was placed into a posterior splint by the ER provider and is to be admitted at this time in preparation for definitive management of this injury.  Patient Active Problem List   Diagnosis Date Noted  . Ankle fracture, right 10/18/2019   History reviewed. No pertinent past medical history.   No Known Allergies  Social History   Tobacco Use  . Smoking status: Not on file  Substance Use Topics  . Alcohol use: Not on file    No family history on file.   Review of Systems: As noted above. The patient denies any chest pain, shortness of breath, nausea, vomiting, diarrhea, constipation, belly pain, blood in her stool, or burning with urination.  Objective: Temp:  [97.9 F (36.6 C)] 97.9 F (36.6 C) (04/08 1943) Pulse Rate:  [82-102] 98 (04/09 0537) Resp:  [14-20] 14 (04/09 0537) BP: (116-175)/(75-91) 123/75 (04/09 0537) SpO2:  [93 %-100 %] 99 % (04/09 0537) Weight:  [70.3 kg] 70.3 kg (04/08 1944)  Physical Exam: General:  Alert, no acute distress Psychiatric:  Patient is competent for consent with normal mood and affect Cardiovascular:  RRR  Respiratory:  Clear to auscultation. No wheezing. Non-labored breathing GI:  Abdomen is soft and non-tender Skin:  No lesions in the area of chief complaint Neurologic:  Sensation intact distally Lymphatic:  No  axillary or cervical lymphadenopathy  Orthopedic Exam:  Orthopedic examination is limited to the right lower leg and foot.  The patient is in a posterior splint with a sugar tong supplement which appears to be fitting her well.  Skin inspection shows no abnormalities of the proximal distal ends of the splint.  She is able dorsiflex and plantarflex her toes.  Sensation is intact to light touch to her toes, including the deep peroneal nerve distribution.  She has good capillary refill to her digits.  Imaging Review: Recent x-rays of the right ankle are available for review and have been reviewed by myself.  These films demonstrate a trimalleolar fracture subluxation of the right ankle with comminuted medial and lateral malleolar fractures and a small posterior lip fracture of the distal tibia.  No significant degenerative changes are identified.  No lytic lesions or other acute bony abnormalities are noted..  Assessment: Closed displaced trimalleolar fracture right ankle.  Plan: The treatment options, including both surgical and nonsurgical choices, have been discussed in detail with the patient.  The patient would like to proceed with surgical intervention to include an open reduction and internal fixation of her right ankle fractures. The risks (including bleeding, infection, nerve and/or blood vessel injury, persistent or recurrent pain, loosening or failure of the components, leg length inequality, dislocation, need for further surgery, blood clots, strokes, heart attacks or arrhythmias, pneumonia, etc.) and benefits of the surgical procedure were discussed. The patient states her understanding and agrees to proceed. A formal written consent will be obtained  by the nursing staff.

## 2019-10-19 NOTE — ED Notes (Signed)
Patient aware that we need urine sample for testing, unable at this time. Pt given instruction on providing urine sample when able to do so.   

## 2019-10-19 NOTE — ED Notes (Signed)
Posterior OCL with stirrup splint performed. Notified Jonathan,PA and approved splint.  Pt did not report any discomfort or pain after splint placed.

## 2019-10-19 NOTE — Progress Notes (Signed)
Up to chair

## 2019-10-20 LAB — BASIC METABOLIC PANEL
Anion gap: 6 (ref 5–15)
BUN: 15 mg/dL (ref 6–20)
CO2: 28 mmol/L (ref 22–32)
Calcium: 8.7 mg/dL — ABNORMAL LOW (ref 8.9–10.3)
Chloride: 104 mmol/L (ref 98–111)
Creatinine, Ser: 0.55 mg/dL (ref 0.44–1.00)
GFR calc Af Amer: >60 mL/min (ref >60)
GFR calc non Af Amer: >60 mL/min (ref >60)
Glucose, Bld: 110 mg/dL — ABNORMAL HIGH (ref 70–99)
Potassium: 3.5 mmol/L (ref 3.5–5.1)
Sodium: 138 mmol/L (ref 135–145)

## 2019-10-20 MED ORDER — ASPIRIN EC 325 MG PO TBEC: 325.0000 mg | DELAYED_RELEASE_TABLET | Freq: Every day | ORAL | 0 refills | Status: AC

## 2019-10-20 MED ORDER — OXYCODONE HCL 5 MG PO TABS: 5.0000 mg | ORAL_TABLET | ORAL | 0 refills | Status: DC | PRN

## 2019-10-20 MED ORDER — KETOROLAC TROMETHAMINE 15 MG/ML IJ SOLN
INTRAMUSCULAR | Status: AC
  Filled 2019-10-20: qty 1

## 2019-10-20 MED ORDER — ACETAMINOPHEN 500 MG PO TABS
ORAL_TABLET | ORAL | Status: AC
  Administered 2019-10-20: 08:00:00 1000 mg via ORAL
  Filled 2019-10-20: qty 2

## 2019-10-20 MED ORDER — ACETAMINOPHEN 500 MG PO TABS
ORAL_TABLET | ORAL | Status: AC
  Administered 2019-10-20: 1000 mg via ORAL
  Filled 2019-10-20: qty 2

## 2019-10-20 MED ORDER — TRAMADOL HCL 50 MG PO TABS
ORAL_TABLET | ORAL | Status: AC
  Administered 2019-10-20: 06:00:00 50 mg via ORAL
  Filled 2019-10-20: qty 1

## 2019-10-20 MED ORDER — KETOROLAC TROMETHAMINE 15 MG/ML IJ SOLN
INTRAMUSCULAR | Status: AC
  Administered 2019-10-20: 03:00:00 15 mg via INTRAVENOUS
  Filled 2019-10-20: qty 1

## 2019-10-20 MED ORDER — ONDANSETRON 4 MG PO TBDP
ORAL_TABLET | ORAL | Status: AC
  Filled 2019-10-20: qty 1

## 2019-10-20 MED ORDER — ENOXAPARIN SODIUM 40 MG/0.4ML ~~LOC~~ SOLN
SUBCUTANEOUS | Status: AC
  Administered 2019-10-20: 08:00:00 40 mg via SUBCUTANEOUS
  Filled 2019-10-20: qty 0.4

## 2019-10-20 NOTE — Progress Notes (Signed)
Dr. Allena Katz in to talk with patient.

## 2019-10-20 NOTE — Discharge Summary (Addendum)
Physician Discharge Summary  Patient ID: Stephanie Matthews MRN: [id number] DOB/AGE: 03-23-1961 59 y.o.  Admit date: 10/18/2019 Discharge date: 10/21/2019 Admission Diagnoses:  Ankle fracture, right [W09.811B] Pre-op chest exam [Z01.811] Closed trimalleolar fracture of right ankle, initial encounter [S82.851A]   Discharge Diagnoses: Patient Active Problem List   Diagnosis Date Noted  . Ankle fracture, right 10/18/2019    Past Medical History:  Diagnosis Date  . Hypertension      Transfusion: none   Consultants (if any):   Discharged Condition: Improved  Hospital Course: Stephanie Matthews is an 59 y.o. female who was admitted 10/18/2019 with a diagnosis of displaced right ankle fracture and went to the operating room on 10/19/2019 and underwent the above named procedures.    Surgeries: Procedure(s): OPEN REDUCTION INTERNAL FIXATION (ORIF) ANKLE FRACTURE on 10/19/2019 Patient tolerated the surgery well. Taken to PACU where she was stabilized and then transferred to the orthopedic floor.  Started on Lovenox 40 mg q 24 hrs. SCDs. Heels elevated on bed with rolled towels. No evidence of DVT. Negative Homan.  Physical therapy started on day #1 for gait training and transfer.   Patient's foley was d/c on day #1. Patient's IV  was d/c on day #1.  On post op day #2 patient made good progress with PT, was able to ambulate safely with walker, patient was stable and ready for discharge to home.  Implants: [employer]omet ALPS 7-hole composite locking plate and screws  She was given perioperative antibiotics:  Anti-infectives (From admission, onward)   Start     Dose/Rate Route Frequency Ordered Stop   10/19/19 1615  ceFAZolin (ANCEF) IVPB 2g/100 mL premix     2 g 200 mL/hr over 30 Minutes Intravenous Every 6 hours 10/19/19 1608 10/20/19 0659   10/19/19 1300  ceFAZolin (ANCEF) IVPB 2g/100 mL premix  Status:  Discontinued     2 g 200 mL/hr over 30 Minutes Intravenous  Once 10/18/19 2203 10/19/19  1330    .  She was given sequential compression devices, early ambulation, and Aspirin  for DVT prophylaxis.  She benefited maximally from the hospital stay and there were no complications.    Recent vital signs:  Vitals:   10/19/19 2044 10/20/19 0600  BP: 108/68 113/69  Pulse: 78 70  Resp: 16 17  Temp: 97.9 F (36.6 C) 98.4 F (36.9 C)  SpO2: 96% 95%    Recent laboratory studies:  Lab Results  Component Value Date   HGB 14.0 10/18/2019   Lab Results  Component Value Date   WBC 14.3 (H) 10/18/2019   PLT 412 (H) 10/18/2019   No results found for: INR Lab Results  Component Value Date   NA 138 10/20/2019   K 3.5 10/20/2019   CL 104 10/20/2019   CO2 28 10/20/2019   BUN 15 10/20/2019   CREATININE 0.55 10/20/2019   GLUCOSE 110 (H) 10/20/2019    Discharge Medications:   Allergies as of 10/20/2019   No Known Allergies     Medication List    TAKE these medications   aspirin EC 325 MG tablet Take 1 tablet (325 mg total) by mouth daily.   chlorthalidone 25 MG tablet Commonly known as: HYGROTON Take 25 mg by mouth every morning.   losartan 50 MG tablet Commonly known as: COZAAR Take 75 mg by mouth daily.   oxyCODONE 5 MG immediate release tablet Commonly known as: Oxy IR/ROXICODONE Take 1-2 tablets (5-10 mg total) by mouth every 4 (four) hours as needed  for moderate pain (pain score 4-6).            Durable Medical Equipment  (From admission, onward)         Start     Ordered   10/19/19 1609  DME Bedside commode  Once    Question:  Patient needs a bedside commode to treat with the following condition  Answer:  Ankle fracture, right   10/19/19 1608   10/19/19 1609  DME Walker rolling  Once    Question Answer Comment  Walker: With 5 Inch Wheels   Patient needs a walker to treat with the following condition Ankle fracture, right      10/19/19 1608          Diagnostic Studies: DG Chest 1 View  Result Date: 10/18/2019 CLINICAL DATA:   59 year old female with ankle fracture. EXAM: CHEST  1 VIEW COMPARISON:  None. FINDINGS: The heart size and mediastinal contours are within normal limits. Both lungs are clear. The visualized skeletal structures are unremarkable. IMPRESSION: No active disease. Electronically Signed   By: Anner Crete M.D.   On: 10/18/2019 22:12   DG Ankle 2 Views Right  Result Date: 10/19/2019 CLINICAL DATA:  ORIF right ankle EXAM: RIGHT ANKLE - 2 VIEW; DG C-ARM 1-60 MIN COMPARISON:  10/18/2019 FINDINGS: Four C-arm images show plate and screw fixation of the distal fibular fracture in screw fixation of the medial malleolar fracture. Components appear well positioned. Restoration of anatomic alignment. IMPRESSION: Good appearance following ORIF. Good appearance following ORIF of distal fibular and tibial fractures. Electronically Signed   By: Nelson Chimes M.D.   On: 10/19/2019 15:06   DG Ankle Complete Right  Result Date: 10/18/2019 CLINICAL DATA:  59 year old female status post right ankle rolling injury while walking outside. EXAM: RIGHT ANKLE - COMPLETE 3+ VIEW COMPARISON:  None. FINDINGS: Severe soft tissue swelling about the right ankle. Comminuted, transverse fracture through the medial malleolus. Mild lateral displacement of the mortise joint. Comminuted spiral fracture through the distal right fibula metadiaphysis. Mildly displaced fracture of the posterior malleolus. Talar dome intact. Calcaneus intact. Other visible bones of the right foot appear intact. IMPRESSION: Acute comminuted right ankle trimalleolar fracture with mild laterally dislocated mortise joint. Electronically Signed   By: Genevie Ann M.D.   On: 10/18/2019 20:03   DG C-Arm 1-60 Min  Result Date: 10/19/2019 CLINICAL DATA:  ORIF right ankle EXAM: RIGHT ANKLE - 2 VIEW; DG C-ARM 1-60 MIN COMPARISON:  10/18/2019 FINDINGS: Four C-arm images show plate and screw fixation of the distal fibular fracture in screw fixation of the medial malleolar fracture.  Components appear well positioned. Restoration of anatomic alignment. IMPRESSION: Good appearance following ORIF. Good appearance following ORIF of distal fibular and tibial fractures. Electronically Signed   By: Nelson Chimes M.D.   On: 10/19/2019 15:06    Disposition:     Follow-up Information    Lattie Corns, PA-C Follow up in 14 day(s).   Specialty: Physician Assistant Why: for staple and splint removal Contact information: [place] [address] [phone]            Signed: Feliberto Gottron 10/20/2019, 9:16 AM

## 2019-10-20 NOTE — Progress Notes (Signed)
Patient back from physical therapy, patient was sick while doing therapy.  Patient states she has been nauseated since Thursday while here, after pain meds given she said.

## 2019-10-20 NOTE — Progress Notes (Addendum)
Physical Therapy Treatment Patient Details Name: Stephanie Matthews MRN: [id number] DOB: May 02, 1961 Today's Date: 10/20/2019    History of Present Illness Per chart review, pt is a 59 y/o F with a closed displaced trimalleolar fracture of the R ankle and is s/p ORIF on 10/19/2019. PMH includes HTN    PT Comments    Pt pleasant and motivated to participate during the session. Pt had good carryover for gait and stair sequencing this afternoon. Pt was educated on strategies for car transfers and mobility and expressed understanding. Pt will benefit from HHPT services upon discharge to safely address deficits listed in patient problem list for decreased caregiver assistance and eventual return to PLOF.       Follow Up Recommendations  Home health PT;Supervision for mobility/OOB     Equipment Recommendations  Rolling walker with 5" wheels;3in1 (PT)    Recommendations for Other Services       Precautions / Restrictions Precautions Precautions: None Restrictions Weight Bearing Restrictions: Yes RLE Weight Bearing: Non weight bearing    Mobility  Bed Mobility             General bed mobility comments: Pt found in recliner  Transfers Overall transfer level: Needs assistance Equipment used: Rolling walker (2 wheeled) Transfers: Sit to/from Stand Sit to Stand: Supervision         General transfer comment: No physical assistance needed, SBA to ensure NWB status  Ambulation/Gait Ambulation/Gait assistance: Min guard Gait Distance (Feet): 4 Feet(x3); attempted short ambulation distances with varying RW heights.  Assistive device: Rolling walker (2 wheeled)   Gait velocity: decreased   General Gait Details: Hop-to with RW. Pt demonstarted good understand of WBS, and sequencing of gait.   Stairs Stairs: Yes Stairs assistance: Min guard;+2 safety/equipment Stair Management: Backwards;Forwards;With walker(Backwards on way up, forwards on way down) Number of Stairs:  4 General stair comments: Pt had good carryover with sequencing and was able to complete teachback for stairs.   Wheelchair Mobility    Modified Rankin (Stroke Patients Only)       Balance Overall balance assessment: Needs assistance Sitting-balance support: Feet unsupported;No upper extremity supported Sitting balance-Leahy Scale: Good     Standing balance support: Bilateral upper extremity supported;During functional activity Standing balance-Leahy Scale: Fair Standing balance comment: Total BUE assist through RW during mobility secondary to NWB status of RLE                            Cognition Arousal/Alertness: Awake/alert Behavior During Therapy: WFL for tasks assessed/performed Overall Cognitive Status: Within Functional Limits for tasks assessed                                        Exercises Total Joint Exercises Other Exercises: Author verbally and visually demonstrated car tansfers Other Exercises: stair training Other Exercises: teachback method for stair training Other Exercises: education on gait sequencing    General Comments        Pertinent Vitals/Pain Pain Assessment: 0-10 Pain Score: 2  Pain Descriptors / Indicators: Aching;Sore Pain Intervention(s): Premedicated before session;Monitored during session    [place] expects to be discharged to:: Private residence Living Arrangements: Children Available Help at Discharge: Family;Available 24 hours/day(Mostly daughter but her mother can help as needed as well) Type of Home: Mobile home Home Access: Stairs to enter Entrance Stairs-Rails: Right;Left;Can reach both  Home Layout: One level Home Equipment: Walker - 4 wheels      Prior Function Level of Independence: Independent      Comments: Community amb, ind with ADL's, no AD use, denies falls   PT Goals (current goals can now be found in the care plan section) Acute Rehab PT Goals Patient Stated  Goal: get stronger PT Goal Formulation: With patient Time For Goal Achievement: 11/02/19 Potential to Achieve Goals: Good Progress towards PT goals: Progressing toward goals    Frequency    BID      PT Plan Current plan remains appropriate    Co-evaluation              AM-PAC PT "6 Clicks" Mobility   Outcome Measure  Help needed turning from your back to your side while in a flat bed without using bedrails?: None Help needed moving from lying on your back to sitting on the side of a flat bed without using bedrails?: None Help needed moving to and from a bed to a chair (including a wheelchair)?: A Little Help needed standing up from a chair using your arms (e.g., wheelchair or bedside chair)?: A Little Help needed to walk in hospital room?: A Little Help needed climbing 3-5 steps with a railing? : A Little 6 Click Score: 20    End of Session Equipment Utilized During Treatment: Gait belt Activity Tolerance: Patient tolerated treatment well Patient left: in chair;with call bell/phone within reach;with chair alarm set;with nursing/sitter in room Nurse Communication: Mobility status;Precautions;Weight bearing status PT Visit Diagnosis: Other abnormalities of gait and mobility (R26.89);Pain Pain - Right/Left: Right Pain - part of body: Ankle and joints of foot     Time: 1355-1422 PT Time Calculation (min) (ACUTE ONLY): 27 min  Charges:  $Gait Training: 8-22 mins $Therapeutic Exercise: 8-22 mins                     Veleta Miners, SPT 10/20/19 3:22 PM

## 2019-10-20 NOTE — Progress Notes (Signed)
Patient states she would feel more comfortable staying tonight also.  Will let [place] Neighbor know.

## 2019-10-20 NOTE — Progress Notes (Signed)
Physical therapy at bedside to evaluate patient.

## 2019-10-20 NOTE — Social Work (Addendum)
TOC CM/SW social worker called left message on pt's cell.  Assessment of pt's need for DME rolling walker and bedside commode.   1300 Social worker met with patient at patient's bedside.  Patient noted that PT delivered new rolling walker and 3-in-1 bedside commode. Patient is using [employer]. Social worker called [employer]/[facility] confirmed equipment delivery.     Signed and Held Orders    None

## 2019-10-20 NOTE — Progress Notes (Signed)
Lab here for blood draw

## 2019-10-20 NOTE — Progress Notes (Signed)
   Subjective: 1 Day Post-Op Procedure(s) (LRB): OPEN REDUCTION INTERNAL FIXATION (ORIF) ANKLE FRACTURE (Right) Patient reports pain as mild.   Patient is well, and has had no acute complaints or problems Denies any CP, SOB, ABD pain. We will continue therapy today.  Plan is to go Home after hospital stay.  Objective: Vital signs in last 24 hours: Temp:  [96.9 F (36.1 C)-98.6 F (37 C)] 98.4 F (36.9 C) (04/10 0600) Pulse Rate:  [68-101] 70 (04/10 0600) Resp:  [11-21] 17 (04/10 0600) BP: (101-141)/(67-102) 113/69 (04/10 0600) SpO2:  [93 %-100 %] 95 % (04/10 0600) Weight:  [70.3 kg] 70.3 kg (04/09 0939)  Intake/Output from previous day: 04/09 0701 - 04/10 0700 In: 715.1 [P.O.:360; I.V.:305.1; IV Piggyback:50] Out: 300 [Urine:300] Intake/Output this shift: No intake/output data recorded.  Recent Labs    10/18/19 2246  HGB 14.0   Recent Labs    10/18/19 2246  WBC 14.3*  RBC 4.65  HCT 40.8  PLT 412*   Recent Labs    10/18/19 2246 10/19/19 0540  NA 137 136  K 2.6* 3.5  CL 99 102  CO2 25 26  BUN 21* 16  CREATININE 0.73 0.56  GLUCOSE 125* 115*  CALCIUM 9.7 8.9   No results for input(s): LABPT, INR in the last 72 hours.  EXAM General - Patient is Alert, Appropriate and Oriented Extremity - Neurovascular intact Sensation intact distally  Splint CDI RLE Motor Function - intact, moving and toes well on exam.   Past Medical History:  Diagnosis Date  . Hypertension     Assessment/Plan:   1 Day Post-Op Procedure(s) (LRB): OPEN REDUCTION INTERNAL FIXATION (ORIF) ANKLE FRACTURE (Right) Active Problems:   Ankle fracture, right  Estimated body mass index is 24.28 kg/m as calculated from the following:   Height as of this encounter: 5\' 7"  (1.702 m).   Weight as of this encounter: 70.3 kg. Advance diet Up with therapy, NWB RLE Pain controlled VSS Possible discharge home today pending good progress with PT.  DVT Prophylaxis - Lovenox and SCDs    T.  , PA-C [facility] 10/20/2019, 7:21 AM

## 2019-10-20 NOTE — Progress Notes (Signed)
Patient off the unit with physical therapy.

## 2019-10-20 NOTE — Progress Notes (Signed)
Called physical therapy to make sure they are aware patient is to be seen by them today.  [phone].  They are aware and will come evaluate patient.

## 2019-10-20 NOTE — Progress Notes (Signed)
Physical therapy to work with patient again after lunch to make sure she is comfortable with steps.

## 2019-10-20 NOTE — Progress Notes (Signed)
Pt given Tylenol and Toradol per orders.  Pt is comfortable, and states pain at 0/10.  Pt asked if there is anything she needed or wanted and pt states that she is fine and comfortable and does not want anything at this time.

## 2019-10-20 NOTE — Discharge Instructions (Signed)
Diet: As you were doing prior to hospitalization   Dressing:  Keep splint on, clean and dry  Activity:  Increase activity slowly as tolerated, but follow the weight bearing instructions below.  Keep Right lower extremity elevated  Weight Bearing:   Non Weight bearing on right lower extremity  To prevent constipation: you may use a stool softener such as -  Colace (over the counter) 100 mg by mouth twice a day  Drink plenty of fluids (prune juice may be helpful) and high fiber foods Miralax (over the counter) for constipation as needed.    Itching:  If you experience itching with your medications, try taking only a single pain pill, or even half a pain pill at a time.  You may take up to 10 pain pills per day, and you can also use benadryl over the counter for itching or also to help with sleep.   Precautions:  If you experience chest pain or shortness of breath - call 911 immediately for transfer to the hospital emergency department!!  If you develop a fever greater that 101 F, purulent drainage from wound, increased redness or drainage from wound, or calf pain-Call [facility]                                              Follow- Up Appointment:  Please call for an appointment to be seen in 2 weeks at [facility]

## 2019-10-20 NOTE — Progress Notes (Signed)
Food patient ordered delivered to her.

## 2019-10-20 NOTE — Evaluation (Signed)
Physical Therapy Evaluation Patient Details Name: KINDAL PONTI MRN: [id number] DOB: 12/11/1960 Today's Date: 10/20/2019   History of Present Illness  Per chart review, pt is a 59 y/o F with a closed displaced trimalleolar fracture of the R ankle and is s/p ORIF on 10/19/2019. PMH includes HTN.   Clinical Impression  Pt pleasant and motivated to participate during the session. Pt found on RA and SpO2 and HR were WL t/o session. Pt consistently demonstrated good understanding WBS and sequencing during gait. For stair training, pt required max VC for sequencing and required minimal cueing with teachback method as she explained the sequencing afterwards. Pt will benefit from HHPT services upon discharge to safely address deficits listed in patient problem list for decreased caregiver assistance and eventual return to PLOF.       Follow Up Recommendations Home health PT;Supervision for mobility/OOB    Equipment Recommendations  Rolling walker with 5" wheels;3in1 (PT)    Recommendations for Other Services       Precautions / Restrictions Precautions Precautions: None Restrictions Weight Bearing Restrictions: Yes RLE Weight Bearing: Non weight bearing      Mobility  Bed Mobility Overal bed mobility: Modified Independent             General bed mobility comments: Extra time and effort but no cueing or assistance required  Transfers Overall transfer level: Needs assistance Equipment used: Rolling walker (2 wheeled) Transfers: Sit to/from Stand Sit to Stand: Supervision         General transfer comment: No physical assistance needed, SBA to ensure NWB status  Ambulation/Gait Ambulation/Gait assistance: Min guard Gait Distance (Feet): 10 Feet Assistive device: Rolling walker (2 wheeled)   Gait velocity: decreased   General Gait Details: Hop to with RW. Pt demonstarted good understand of WBS, and sequencing of gait.  Stairs Stairs: Yes Stairs assistance: Min  guard;+2 safety/equipment Stair Management: Backwards;Forwards;With walker(Backwards on way up, forwards on way down) Number of Stairs: 4 General stair comments: Pt completed stair training with maximal cueing for sequencing. When asked to explain the procedure through teach-back, pt was able to correctly sequence the process between 50-75% of the time and with minimal cueing, pt was able to sequence process 100% of the time.  Wheelchair Mobility    Modified Rankin (Stroke Patients Only)       Balance Overall balance assessment: Needs assistance Sitting-balance support: Feet unsupported;No upper extremity supported Sitting balance-Leahy Scale: Good     Standing balance support: Bilateral upper extremity supported;During functional activity Standing balance-Leahy Scale: Fair Standing balance comment: Total BUE assist through RW during mobility secondary to NWB status of RLE                             Pertinent Vitals/Pain Pain Assessment: 0-10 Pain Score: 2  Pain Descriptors / Indicators: Aching;Sore Pain Intervention(s): Monitored during session;Premedicated before session    Home Living Family/patient expects to be discharged to:: Private residence Living Arrangements: Children Available Help at Discharge: Family;Available 24 hours/day(Mostly daughter but her mother can help as needed as well) Type of Home: Mobile home Home Access: Stairs to enter Entrance Stairs-Rails: Right;Left;Can reach both Entrance Stairs-Number of Steps: 3 Home Layout: One level Home Equipment: Walker - 4 wheels      Prior Function Level of Independence: Independent         Comments: Community amb, ind with ADL's, no AD use, denies falls     Hand Dominance  Extremity/Trunk Assessment        Lower Extremity Assessment Lower Extremity Assessment: RLE deficits/detail RLE: Unable to fully assess due to immobilization       Communication   Communication: No  difficulties  Cognition Arousal/Alertness: Awake/alert Behavior During Therapy: WFL for tasks assessed/performed Overall Cognitive Status: Within Functional Limits for tasks assessed                                        General Comments      Exercises Total Joint Exercises Ankle Circles/Pumps: AROM;Strengthening;Both;10 reps Quad Sets: Strengthening;Both;10 reps Gluteal Sets: Strengthening;Both;10 reps Long Arc Quad: AROM;Strengthening;Both;10 reps Other Exercises Other Exercises: stair training Other Exercises: teachback method for stair training Other Exercises: education WBS Other Exercises: education on gait sequencing   Assessment/Plan    PT Assessment Patient needs continued PT services  PT Problem List Decreased strength;Decreased mobility;Decreased range of motion;Decreased knowledge of precautions;Decreased activity tolerance;Decreased balance;Decreased knowledge of use of DME;Pain       PT Treatment Interventions DME instruction;Therapeutic exercise;Gait training;Balance training;Stair training;Functional mobility training;Therapeutic activities    PT Goals (Current goals can be found in the Care Plan section)  Acute Rehab PT Goals Patient Stated Goal: get stronger PT Goal Formulation: With patient Time For Goal Achievement: 11/02/19 Potential to Achieve Goals: Good    Frequency BID   Barriers to discharge        Co-evaluation               AM-PAC PT "6 Clicks" Mobility  Outcome Measure Help needed turning from your back to your side while in a flat bed without using bedrails?: None Help needed moving from lying on your back to sitting on the side of a flat bed without using bedrails?: None Help needed moving to and from a bed to a chair (including a wheelchair)?: A Little Help needed standing up from a chair using your arms (e.g., wheelchair or bedside chair)?: A Little Help needed to walk in hospital room?: A Little Help needed  climbing 3-5 steps with a railing? : A Little 6 Click Score: 20    End of Session Equipment Utilized During Treatment: Gait belt Activity Tolerance: Patient tolerated treatment well Patient left: in chair;with call bell/phone within reach;with chair alarm set;with nursing/sitter in room Nurse Communication: Mobility status;Precautions;Weight bearing status;Other (comment)(Pt vomitted during session) PT Visit Diagnosis: Other abnormalities of gait and mobility (R26.89);Pain Pain - Right/Left: Right Pain - part of body: Ankle and joints of foot    Time: 9166-0600 PT Time Calculation (min) (ACUTE ONLY): 48 min   Charges:              Annabelle Harman, SPT 10/20/19 1:47 PM

## 2019-10-20 NOTE — Progress Notes (Signed)
Patient talking on the phone with her daughter.  Patient also looking over menu to order her breakfast.

## 2019-10-20 NOTE — Progress Notes (Signed)
[facility] Neighbor here to evaluate patient.  Will see how patient does with physical therapy and possibly discharge home today.

## 2019-10-21 DIAGNOSIS — Z8781 Personal history of (healed) traumatic fracture: Secondary | ICD-10-CM

## 2019-10-21 LAB — BASIC METABOLIC PANEL
Anion gap: 9 (ref 5–15)
BUN: 16 mg/dL (ref 6–20)
CO2: 29 mmol/L (ref 22–32)
Calcium: 8.7 mg/dL — ABNORMAL LOW (ref 8.9–10.3)
Chloride: 101 mmol/L (ref 98–111)
Creatinine, Ser: 0.66 mg/dL (ref 0.44–1.00)
GFR calc Af Amer: >60 mL/min (ref >60)
GFR calc non Af Amer: >60 mL/min (ref >60)
Glucose, Bld: 102 mg/dL — ABNORMAL HIGH (ref 70–99)
Potassium: 3.2 mmol/L — ABNORMAL LOW (ref 3.5–5.1)
Sodium: 139 mmol/L (ref 135–145)

## 2019-10-21 MED ORDER — TRAMADOL HCL 50 MG PO TABS
ORAL_TABLET | ORAL | Status: AC
  Administered 2019-10-21: 05:00:00 50 mg via ORAL
  Filled 2019-10-21: qty 1

## 2019-10-21 MED ORDER — ENOXAPARIN SODIUM 40 MG/0.4ML ~~LOC~~ SOLN
SUBCUTANEOUS | Status: AC
  Filled 2019-10-21: qty 0.4

## 2019-10-21 NOTE — Progress Notes (Signed)
Patient states she slept well last night.  States she feels great this morning.  Patient denies any pain, not ready for breakfast at this time but will shortly.

## 2019-10-21 NOTE — Progress Notes (Signed)
Physical therapy in working with patient.

## 2019-10-21 NOTE — Progress Notes (Signed)
Physical Therapy Treatment Patient Details Name: Stephanie Matthews MRN: [id number] DOB: July 12, 1961 Today's Date: 10/21/2019    History of Present Illness Per chart review, pt is a 59 y/o F with a closed displaced trimalleolar fracture of the R ankle and is s/p ORIF on 10/19/2019. PMH includes HTN    PT Comments    Bed mobility without assist.  Ambulated 25'  in hallway with safe steady gait NWB with RW. Discussed home safety and plan for entering home.  Completed stairs yesterday and reports being comfortable with them.  Feels better about D/C today.   Follow Up Recommendations  Home health PT;Supervision for mobility/OOB     Equipment Recommendations  Rolling walker with 5" wheels    Recommendations for Other Services       Precautions / Restrictions Restrictions Weight Bearing Restrictions: No RLE Weight Bearing: Non weight bearing    Mobility  Bed Mobility Overal bed mobility: Modified Independent                Transfers Overall transfer level: Needs assistance Equipment used: Rolling walker (2 wheeled) Transfers: Sit to/from Stand Sit to Stand: Supervision            Ambulation/Gait Ambulation/Gait assistance: Supervision;Min guard Gait Distance (Feet): 25 Feet Assistive device: Rolling walker (2 wheeled) Gait Pattern/deviations: Step-to pattern Gait velocity: decreased   General Gait Details: Hop-to with RW. Pt demonstarted good understand of WBS, and sequencing of gait.   Stairs             Wheelchair Mobility    Modified Rankin (Stroke Patients Only)       Balance Overall balance assessment: Needs assistance;Modified Independent Sitting-balance support: Feet unsupported;No upper extremity supported Sitting balance-Leahy Scale: Normal     Standing balance support: Bilateral upper extremity supported;During functional activity Standing balance-Leahy Scale: Good Standing balance comment: steady with RW                            Cognition Arousal/Alertness: Awake/alert Behavior During Therapy: WFL for tasks assessed/performed Overall Cognitive Status: Within Functional Limits for tasks assessed                                        Exercises      General Comments        Pertinent Vitals/Pain Pain Assessment: Faces Faces Pain Scale: Hurts a little bit Pain Descriptors / Indicators: Aching;Sore Pain Intervention(s): Limited activity within patient's tolerance;Monitored during session    Home Living                      Prior Function            PT Goals (current goals can now be found in the care plan section) Progress towards PT goals: Progressing toward goals    Frequency    BID      PT Plan Current plan remains appropriate    Co-evaluation              AM-PAC PT "6 Clicks" Mobility   Outcome Measure  Help needed turning from your back to your side while in a flat bed without using bedrails?: None Help needed moving from lying on your back to sitting on the side of a flat bed without using bedrails?: None Help needed moving to and from a bed to  a chair (including a wheelchair)?: A Little Help needed standing up from a chair using your arms (e.g., wheelchair or bedside chair)?: A Little Help needed to walk in hospital room?: A Little Help needed climbing 3-5 steps with a railing? : A Little 6 Click Score: 20    End of Session Equipment Utilized During Treatment: Gait belt Activity Tolerance: Patient tolerated treatment well Patient left: in bed;with bed alarm set;with call bell/phone within reach   Pain - Right/Left: Right Pain - part of body: Ankle and joints of foot     Time: 5750-5183 PT Time Calculation (min) (ACUTE ONLY): 8 min  Charges:  $Gait Training: 8-22 mins                    Danielle Dess, PTA 10/21/19, 9:11 AM

## 2019-10-21 NOTE — Progress Notes (Signed)
   Subjective: 2 Days Post-Op Procedure(s) (LRB): OPEN REDUCTION INTERNAL FIXATION (ORIF) ANKLE FRACTURE (Right) Patient reports pain as mild.   Patient is well, and has had no acute complaints or problems Denies any CP, SOB, ABD pain. We will continue therapy today.  Plan is to go Home after hospital stay.  Objective: Vital signs in last 24 hours: Temp:  [97.1 F (36.2 C)-99.2 F (37.3 C)] 97.3 F (36.3 C) (04/11 0800) Pulse Rate:  [59-75] 70 (04/11 0800) Resp:  [16-18] 18 (04/11 0800) BP: (104-128)/(70-76) 120/73 (04/11 0800) SpO2:  [96 %-98 %] 96 % (04/11 0800)  Intake/Output from previous day: 04/10 0701 - 04/11 0700 In: 240 [P.O.:240] Out: 300 [Urine:300] Intake/Output this shift: No intake/output data recorded.  Recent Labs    10/18/19 2246  HGB 14.0   Recent Labs    10/18/19 2246  WBC 14.3*  RBC 4.65  HCT 40.8  PLT 412*   Recent Labs    10/20/19 0716 10/21/19 0719  NA 138 139  K 3.5 3.2*  CL 104 101  CO2 28 29  BUN 15 16  CREATININE 0.55 0.66  GLUCOSE 110* 102*  CALCIUM 8.7* 8.7*   No results for input(s): LABPT, INR in the last 72 hours.  EXAM General - Patient is Alert, Appropriate and Oriented Extremity - Neurovascular intact Sensation intact distally  Splint CDI RLE Motor Function - intact, moving and toes well on exam.   Past Medical History:  Diagnosis Date  . Hypertension     Assessment/Plan:   2 Days Post-Op Procedure(s) (LRB): OPEN REDUCTION INTERNAL FIXATION (ORIF) ANKLE FRACTURE (Right) Active Problems:   Ankle fracture, right   Status post open reduction with internal fixation (ORIF) of fracture of ankle  Estimated body mass index is 24.28 kg/m as calculated from the following:   Height as of this encounter: 5\' 7"  (1.702 m).   Weight as of this encounter: 70.3 kg. Advance diet Up with therapy, NWB RLE. Good progress with PT today Pain controlled VSS discharge home today.  DVT Prophylaxis - Lovenox and  SCDs    T. , PA-C [facility] 10/21/2019, 9:43 AM

## 2019-10-23 NOTE — Anesthesia Postprocedure Evaluation (Signed)
Anesthesia Post Note  Patient: Stephanie Matthews  Procedure(s) Performed: OPEN REDUCTION INTERNAL FIXATION (ORIF) ANKLE FRACTURE (Right Ankle)  Patient location during evaluation: PACU Anesthesia Type: General Level of consciousness: awake and alert Pain management: pain level controlled Vital Signs Assessment: post-procedure vital signs reviewed and stable Respiratory status: spontaneous breathing, nonlabored ventilation, respiratory function stable and patient connected to nasal cannula oxygen Cardiovascular status: blood pressure returned to baseline and stable Postop Assessment: no apparent nausea or vomiting Anesthetic complications: no     Last Vitals:  Vitals:   10/21/19 0516 10/21/19 0800  BP: 128/76 120/73  Pulse: 74 70  Resp: 16 18  Temp: 37.3 C (!) 36.3 C  SpO2: 96% 96%    Last Pain:  Vitals:   10/21/19 0800  TempSrc:   PainSc: 0-No pain                 Yevette Edwards

## 2020-03-21 ENCOUNTER — Telehealth: Payer: Self-pay | Admitting: *Deleted

## 2020-03-21 NOTE — Telephone Encounter (Signed)
Pt called and states her AVS info was not correct. It said she was on opoid but states that was when she had her knee replacement and has not been on those meds for 6 weeks now. States all she is taking is prednisone and tylenol 1000 mg every 4 hours. Pt states she never heard back about urgent referral. I talked to brendale who sent over her referral and brendale tried to call their office after I spoke with her about pt not hearing back and states they are closed. There is a walk in clinic. [facility] [address] evenings and weekends from 5:30 - 9 pm [phone]. [address]. Not sure if you would want her to go there or not.

## 2020-09-07 IMAGING — XA DG C-ARM 1-60 MIN
4 series · 4 of 4 positions shown · non-contrast
Comparison: 10/18/2019

CLINICAL DATA: ORIF right ankle

EXAM:
RIGHT ANKLE - 2 VIEW; DG C-ARM 1-60 MIN

[Series 1: cont. · 1 of 1 slices shown (1 of 4)]
[im 1/1]
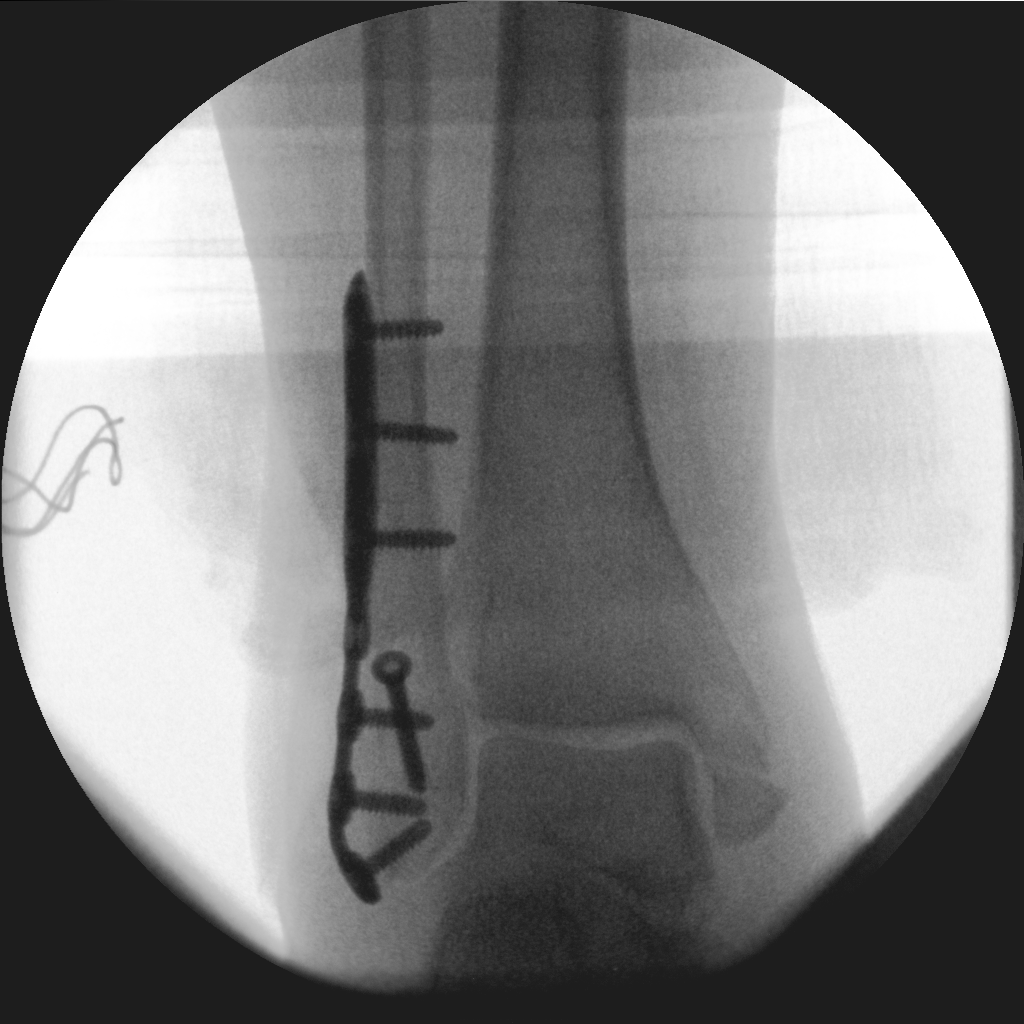

[Series 2: cont. · 1 of 1 slices shown (2 of 4)]
[im 1/1]
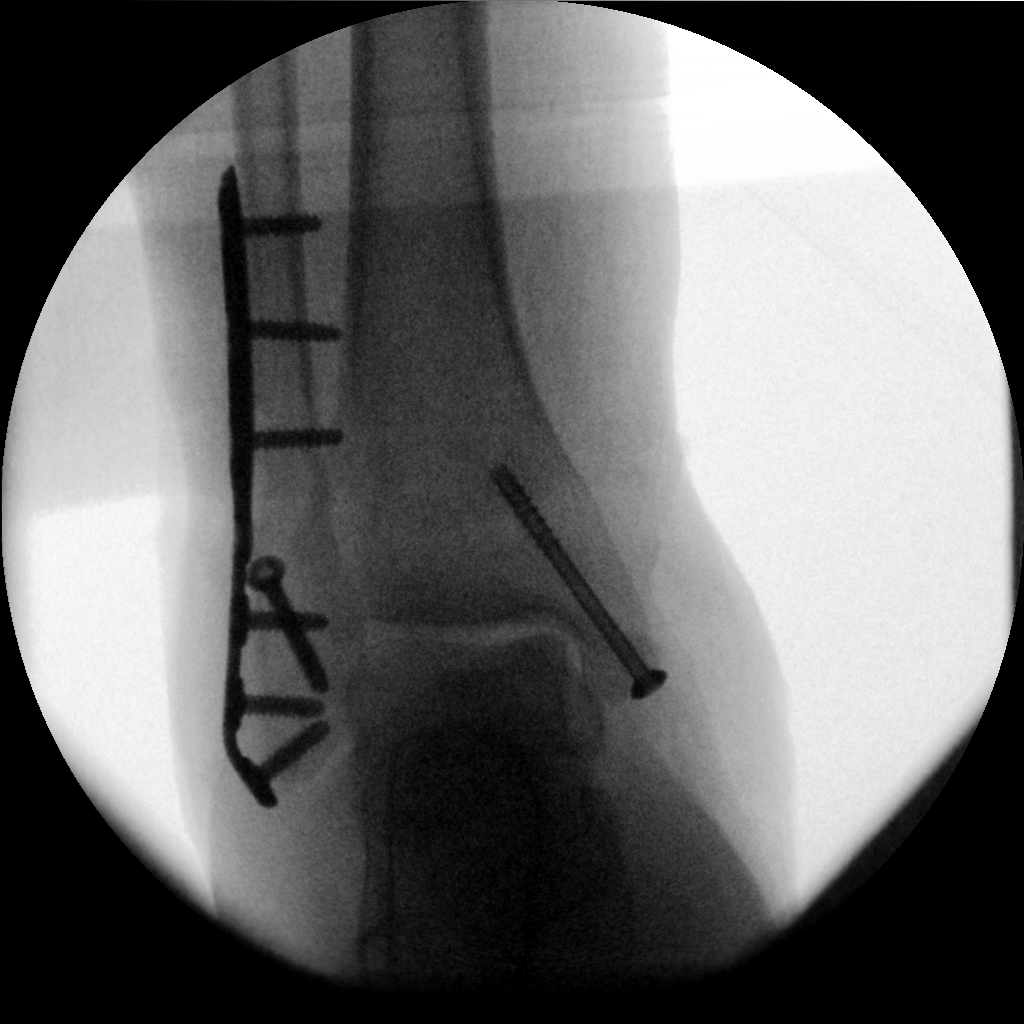

[Series 3: cont. · 1 of 1 slices shown (3 of 4)]
[im 1/1]
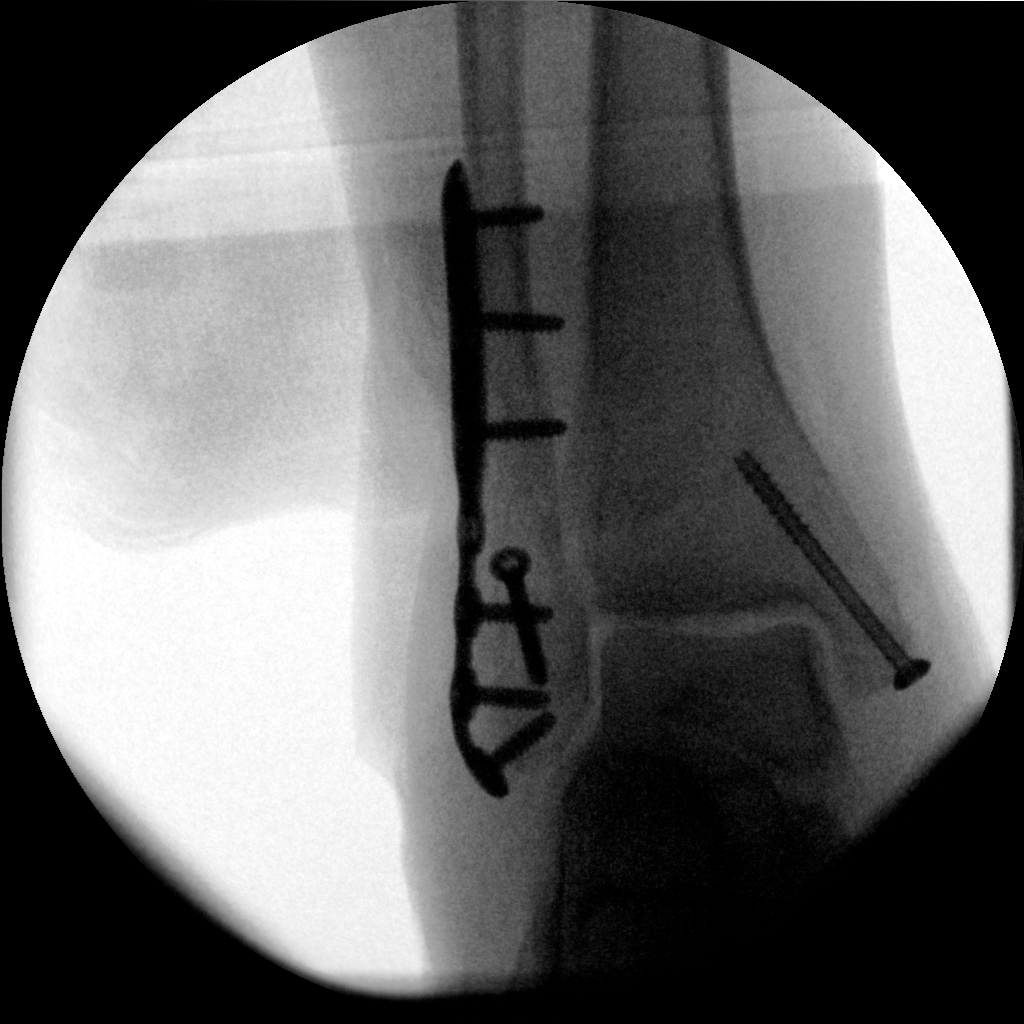

[Series 6: cont. · 1 of 1 slices shown (4 of 4)]
[im 1/1]
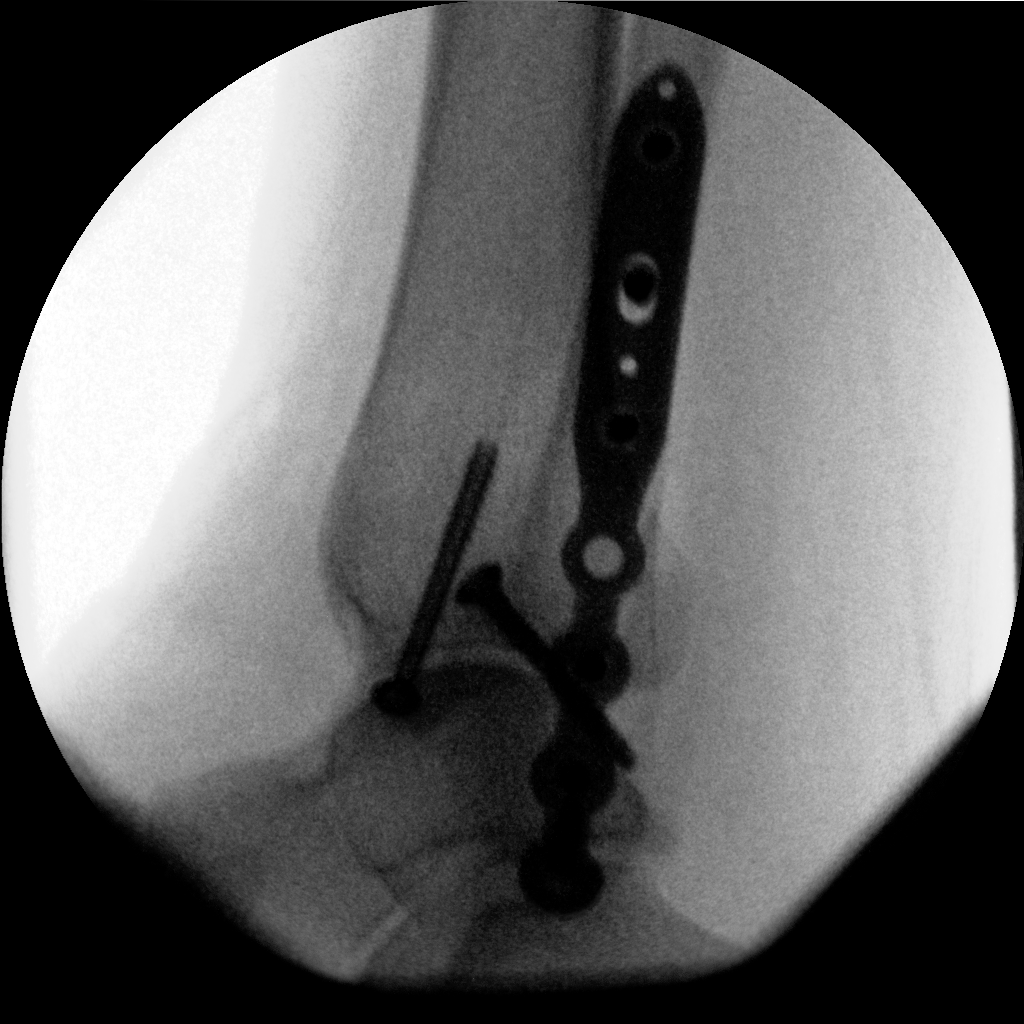

[4 of 4 positions shown; findings below may reference images not displayed]

FINDINGS: Four C-arm images show plate and screw fixation of the distal
fibular fracture in screw fixation of the medial malleolar fracture.
Components appear well positioned. Restoration of anatomic
alignment.
IMPRESSION: Good appearance following ORIF. Good appearance following ORIF of
distal fibular and tibial fractures.

## 2021-10-26 ENCOUNTER — Emergency Department
Admission: EM | Admit: 2021-10-26 | Discharge: 2021-10-26 | Disposition: A | Discharge status: Home / Self Care | Payer: BC Managed Care – PPO | Attending: Emergency Medicine | Admitting: Emergency Medicine

## 2021-10-26 ENCOUNTER — Other Ambulatory Visit: Payer: Self-pay

## 2021-10-26 DIAGNOSIS — Z20822 Contact with and (suspected) exposure to covid-19: Secondary | ICD-10-CM | POA: Insufficient documentation

## 2021-10-26 DIAGNOSIS — R5383 Other fatigue: Secondary | ICD-10-CM | POA: Diagnosis not present

## 2021-10-26 DIAGNOSIS — E876 Hypokalemia: Secondary | ICD-10-CM | POA: Diagnosis not present

## 2021-10-26 DIAGNOSIS — R509 Fever, unspecified: Secondary | ICD-10-CM | POA: Diagnosis present

## 2021-10-26 DIAGNOSIS — R110 Nausea: Secondary | ICD-10-CM | POA: Diagnosis not present

## 2021-10-26 DIAGNOSIS — R531 Weakness: Secondary | ICD-10-CM | POA: Insufficient documentation

## 2021-10-26 DIAGNOSIS — R630 Anorexia: Secondary | ICD-10-CM | POA: Insufficient documentation

## 2021-10-26 DIAGNOSIS — I10 Essential (primary) hypertension: Secondary | ICD-10-CM | POA: Insufficient documentation

## 2021-10-26 LAB — URINALYSIS, ROUTINE W REFLEX MICROSCOPIC
Bacteria, UA: NONE SEEN
Bilirubin Urine: NEGATIVE
Glucose, UA: NEGATIVE mg/dL
Ketones, ur: 20 mg/dL — AB
Leukocytes,Ua: NEGATIVE
Nitrite: NEGATIVE
Protein, ur: NEGATIVE mg/dL
Specific Gravity, Urine: 1.016 (ref 1.005–1.030)
pH: 5 (ref 5.0–8.0)

## 2021-10-26 LAB — RESP PANEL BY RT-PCR (FLU A&B, COVID) ARPGX2
Influenza A by PCR: NEGATIVE
Influenza B by PCR: NEGATIVE
SARS Coronavirus 2 by RT PCR: NEGATIVE

## 2021-10-26 LAB — CBC
HCT: 41 % (ref 36.0–46.0)
Hemoglobin: 13.8 g/dL (ref 12.0–15.0)
MCH: 29.4 pg (ref 26.0–34.0)
MCHC: 33.7 g/dL (ref 30.0–36.0)
MCV: 87.2 fL (ref 80.0–100.0)
Platelets: 428 10*3/uL — ABNORMAL HIGH (ref 150–400)
RBC: 4.7 MIL/uL (ref 3.87–5.11)
RDW: 11.8 % (ref 11.5–15.5)
WBC: 7.9 10*3/uL (ref 4.0–10.5)
nRBC: 0 % (ref 0.0–0.2)

## 2021-10-26 LAB — COMPREHENSIVE METABOLIC PANEL
ALT: 27 U/L (ref 0–44)
AST: 31 U/L (ref 15–41)
Albumin: 4 g/dL (ref 3.5–5.0)
Alkaline Phosphatase: 88 U/L (ref 38–126)
Anion gap: 14 (ref 5–15)
BUN: 18 mg/dL (ref 6–20)
CO2: 30 mmol/L (ref 22–32)
Calcium: 9 mg/dL (ref 8.9–10.3)
Chloride: 90 mmol/L — ABNORMAL LOW (ref 98–111)
Creatinine, Ser: 0.73 mg/dL (ref 0.44–1.00)
GFR, Estimated: >60 mL/min (ref >60)
Glucose, Bld: 91 mg/dL (ref 70–99)
Potassium: 2.8 mmol/L — ABNORMAL LOW (ref 3.5–5.1)
Sodium: 134 mmol/L — ABNORMAL LOW (ref 135–145)
Total Bilirubin: 1.1 mg/dL (ref 0.3–1.2)
Total Protein: 8 g/dL (ref 6.5–8.1)

## 2021-10-26 MED ORDER — ONDANSETRON 4 MG PO TBDP
4.0000 mg | ORAL_TABLET | Freq: Once | ORAL | Status: AC
  Administered 2021-10-26: 4 mg via ORAL
  Filled 2021-10-26: qty 1

## 2021-10-26 MED ORDER — POTASSIUM CHLORIDE CRYS ER 10 MEQ PO TBCR: 10.0000 meq | EXTENDED_RELEASE_TABLET | Freq: Two times a day (BID) | ORAL | 0 refills | Status: AC

## 2021-10-26 NOTE — ED Triage Notes (Signed)
Pt comes with c/o chills, decreased intake and no taste since last Friday. Pt states she just doesn't feel good and thinks she has covid. ?

## 2021-10-26 NOTE — ED Notes (Signed)
E signature pad not working. Pt educated on discharge instructions and verbalized understanding.  

## 2021-10-26 NOTE — ED Notes (Signed)
See triage note  presents with some body aches and chills since Friday  afebrile on arrival ?

## 2021-10-26 NOTE — ED Provider Notes (Signed)
? ?  [facility] ?Provider Note ? ? ? Event Date/Time  ? First MD Initiated Contact with Patient 10/26/21 1729   ?  (approximate) ? ?History  ? ?Chief Complaint: Fever ? ?HPI ? ?Stephanie Matthews is a 61 y.o. female with a past medical history of hypertension presents to the emergency department for medical evaluation.  According to the patient over the past 1 week or so she has not been feeling very well.  Describes nausea with decreased appetite and generalized fatigue.  Patient denies any known fever.  Denies any vomiting or diarrhea.  No chest or abdominal pain no cough or congestion.  Patient states she is also had decreased taste sensation and was concerned that she could have contracted COVID. ? ?Physical Exam  ? ?Triage Vital Signs: ?ED Triage Vitals  ?Enc Vitals Group  ?   BP 10/26/21 1648 122/80  ?   Pulse Rate 10/26/21 1648 (!) 101  ?   Resp 10/26/21 1648 18  ?   Temp 10/26/21 1648 98.9 ?F (37.2 ?C)  ?   Temp Source 10/26/21 1648 Oral  ?   SpO2 10/26/21 1648 99 %  ?   Weight 10/26/21 1733 154 lb 15.7 oz (70.3 kg)  ?   Height 10/26/21 1733 5\' 7"  (1.702 m)  ?   Head Circumference --   ?   Peak Flow --   ?   Pain Score 10/26/21 1647 8  ?   Pain Loc --   ?   Pain Edu? --   ?   Excl. in GC? --   ? ? ?Most recent vital signs: ?Vitals:  ? 10/26/21 1648  ?BP: 122/80  ?Pulse: (!) 101  ?Resp: 18  ?Temp: 98.9 ?F (37.2 ?C)  ?SpO2: 99%  ? ? ?General: Awake, no distress.  ?CV:  Good peripheral perfusion.  Regular rate and rhythm  ?Resp:  Normal effort.  Equal breath sounds bilaterally.  ?Abd:  No distention.  Soft, nontender.  No rebound or guarding. ? ? ?ED Results / Procedures / Treatments  ? ? ?MEDICATIONS ORDERED IN ED: ?Medications  ?ondansetron (ZOFRAN-ODT) disintegrating tablet 4 mg (4 mg Oral Given 10/26/21 1740)  ? ? ? ?IMPRESSION / MDM / ASSESSMENT AND PLAN / ED COURSE  ?I reviewed the triage vital signs and the nursing notes. ? ?Patient presents emergency department for 1 week of fatigue,  nausea, decreased appetite.  Overall the patient appears well, no distress.  Reassuring physical exam.  Overall patient appears well however given 1 week of symptoms we will check basic labs, urinalysis and a COVID/flu swab.  We will treat the patient's nausea with Zofran and continue to closely monitor.  Patient agreeable to plan of care. ? ?Patient's work-up is overall reassuring.  Urinalysis is normal.  COVID/flu negative.  CBC is normal.  Chemistry does show hypokalemia otherwise no concerning findings.  We will prescribe 1 week of potassium supplementation.  Have the patient follow-up with her doctor in 1 week for recheck.  Patient agreeable. ? ?FINAL CLINICAL IMPRESSION(S) / ED DIAGNOSES  ? ?Weakness ? ?Rx / DC Orders  ? ?Potassium chloride ? ?Note:  This document was prepared using Dragon voice recognition software and may include unintentional dictation errors. ?  10/28/21, MD ?10/26/21 2127 ? ?
